# Patient Record
Sex: Female | Born: 1985 | Race: Black or African American | Hispanic: No | Marital: Single | Smoking: Former smoker
Health system: Southern US, Community
[De-identification: ages and names within clinical notes are randomized; demographics above are authoritative.]

## PROBLEM LIST (undated history)

## (undated) ENCOUNTER — Inpatient Hospital Stay (HOSPITAL_COMMUNITY): Payer: Self-pay

## (undated) DIAGNOSIS — O0930 Supervision of pregnancy with insufficient antenatal care, unspecified trimester: Secondary | ICD-10-CM

## (undated) DIAGNOSIS — N760 Acute vaginitis: Secondary | ICD-10-CM

## (undated) DIAGNOSIS — B9689 Other specified bacterial agents as the cause of diseases classified elsewhere: Secondary | ICD-10-CM

## (undated) HISTORY — PX: NO PAST SURGERIES: SHX2092

---
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
## 2007-01-22 ENCOUNTER — Inpatient Hospital Stay (HOSPITAL_COMMUNITY): Admission: AD | Admit: 2007-01-22 | Discharge: 2007-01-22 | Payer: Self-pay | Admitting: Obstetrics

## 2007-01-31 ENCOUNTER — Inpatient Hospital Stay (HOSPITAL_COMMUNITY): Admission: AD | Admit: 2007-01-31 | Discharge: 2007-02-03 | Payer: Self-pay | Admitting: Obstetrics

## 2008-03-31 ENCOUNTER — Emergency Department (HOSPITAL_COMMUNITY): Admission: EM | Admit: 2008-03-31 | Discharge: 2008-03-31 | Payer: Self-pay | Admitting: Family Medicine

## 2008-12-09 ENCOUNTER — Emergency Department (HOSPITAL_COMMUNITY): Admission: EM | Admit: 2008-12-09 | Discharge: 2008-12-09 | Payer: Self-pay | Admitting: Emergency Medicine

## 2009-06-02 ENCOUNTER — Emergency Department (HOSPITAL_COMMUNITY): Admission: EM | Admit: 2009-06-02 | Discharge: 2009-06-02 | Payer: Self-pay | Admitting: Emergency Medicine

## 2009-06-11 IMAGING — US US OB FOLLOW-UP
1 series · 14 of 28 positions shown · non-contrast
Comparison: none

OBSTETRICAL ULTRASOUND:

 This ultrasound exam was performed in the [HOSPITAL] Ultrasound Department.  The OB US report was generated in the AS system, and faxed to the ordering physician.  This report is also available in [REDACTED] PACS.

[Series 1: us ob re-eval · 14 of 47 slices shown]
[im 2/47]
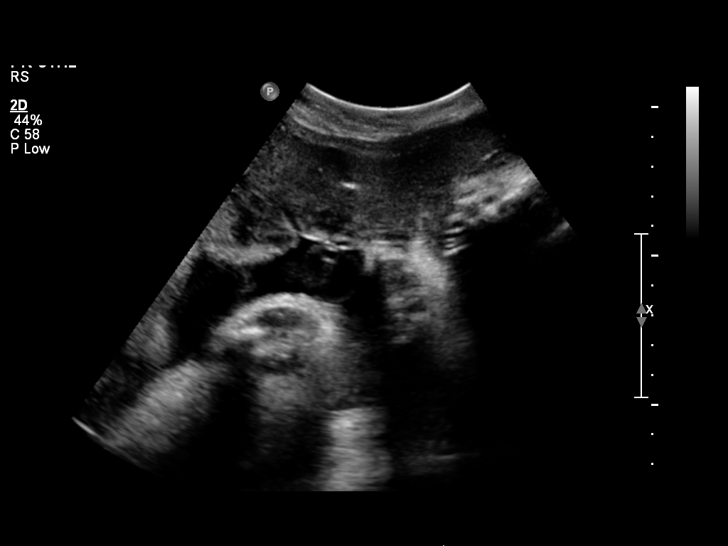
[im 6/47]
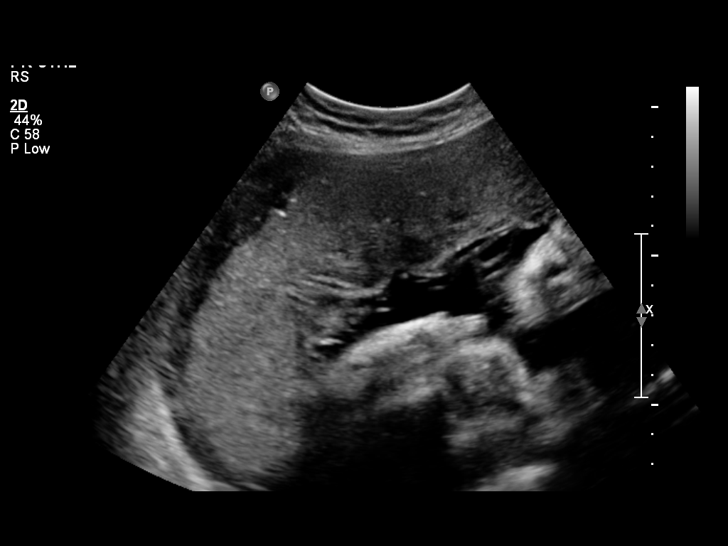
[im 9/47]
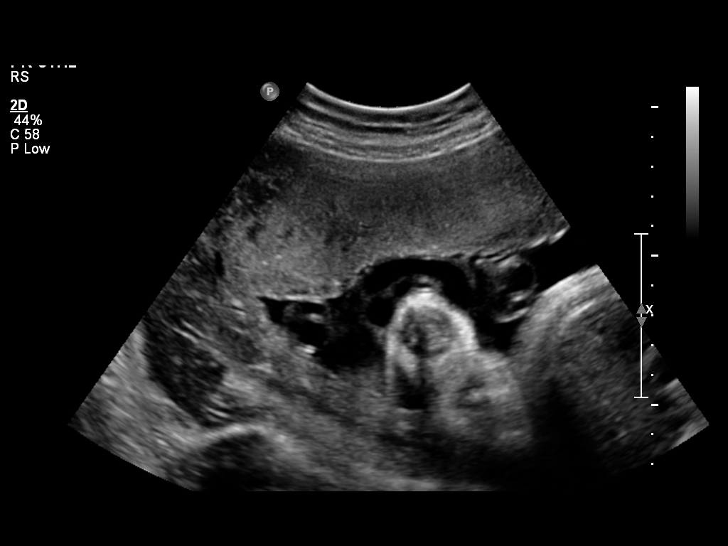
[im 12/47]
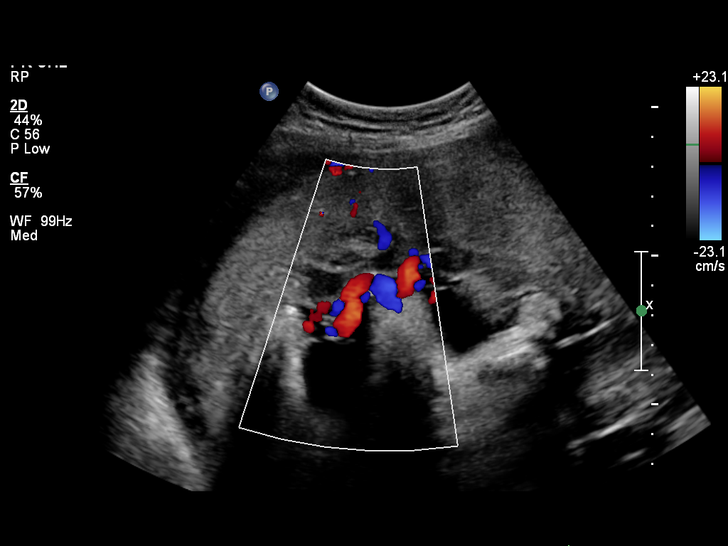
[im 16/47]
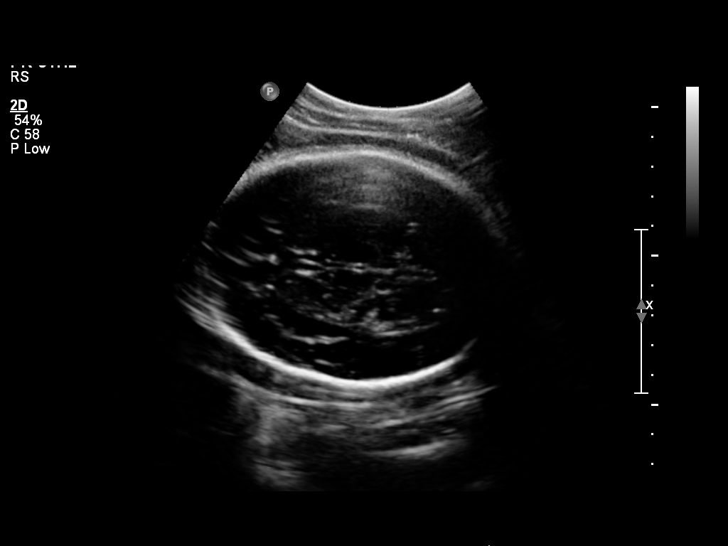
[im 19/47]
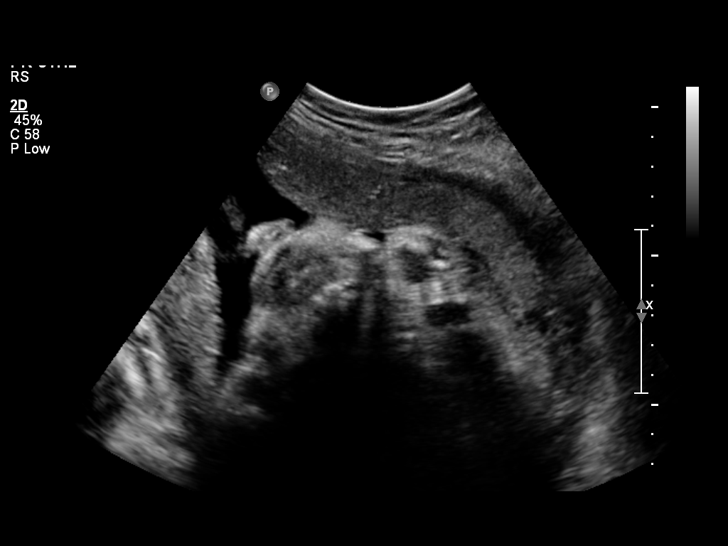
[im 23/47]
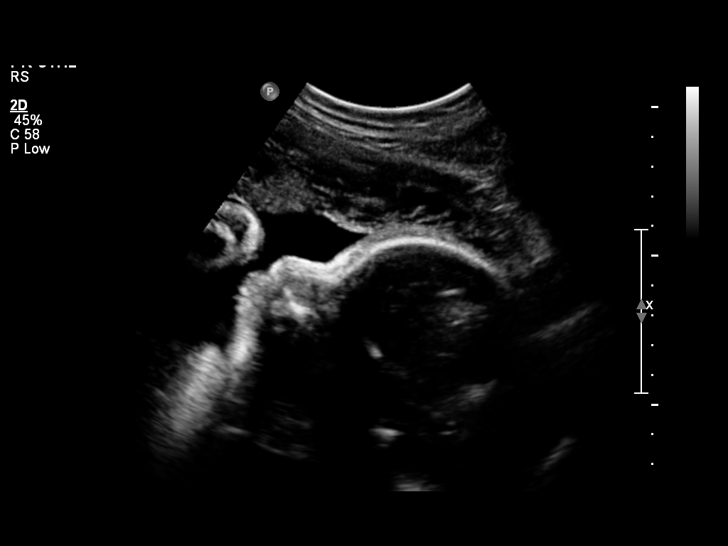
[im 26/47]
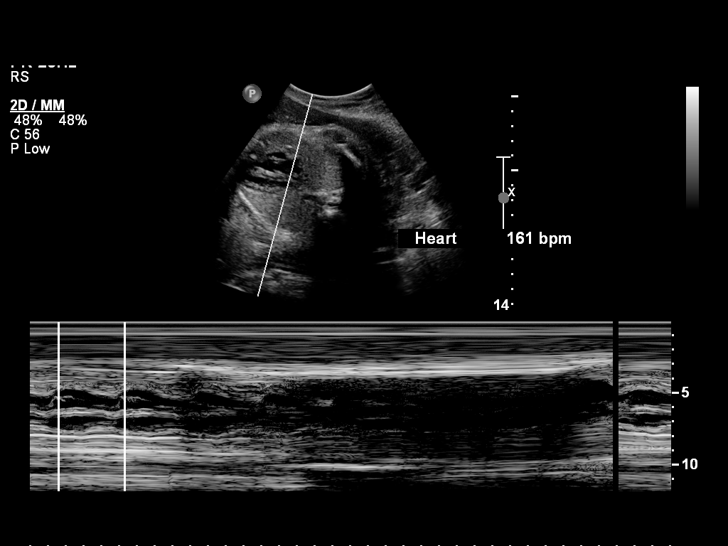
[im 29/47]
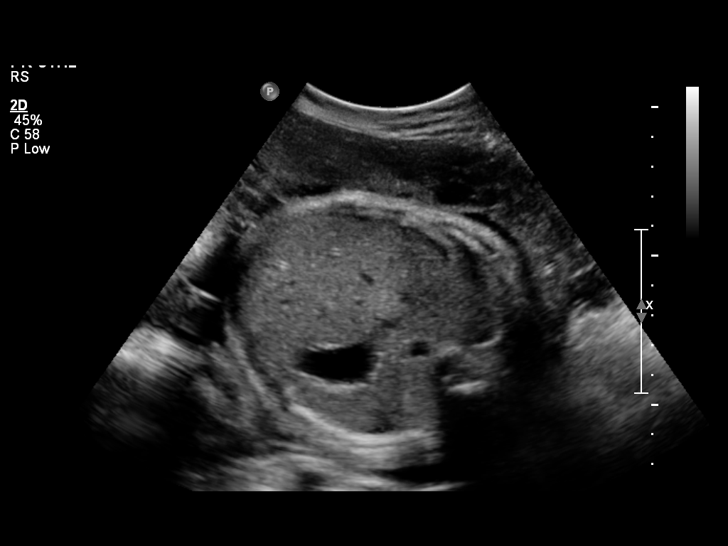
[im 33/47]
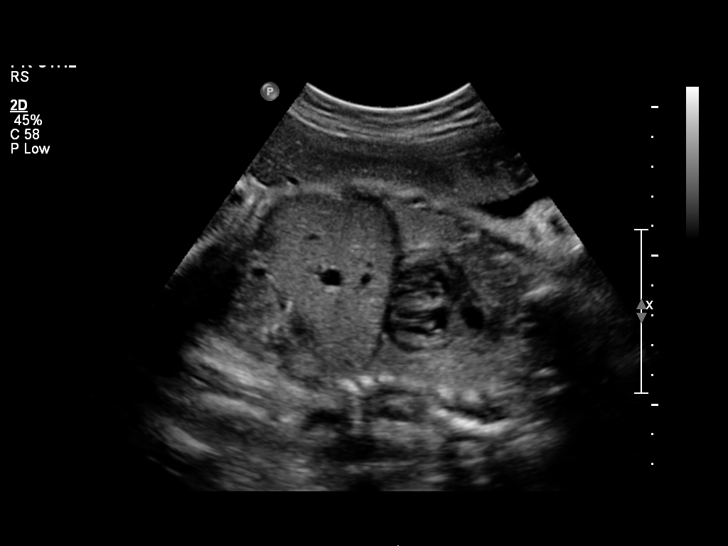
[im 36/47]
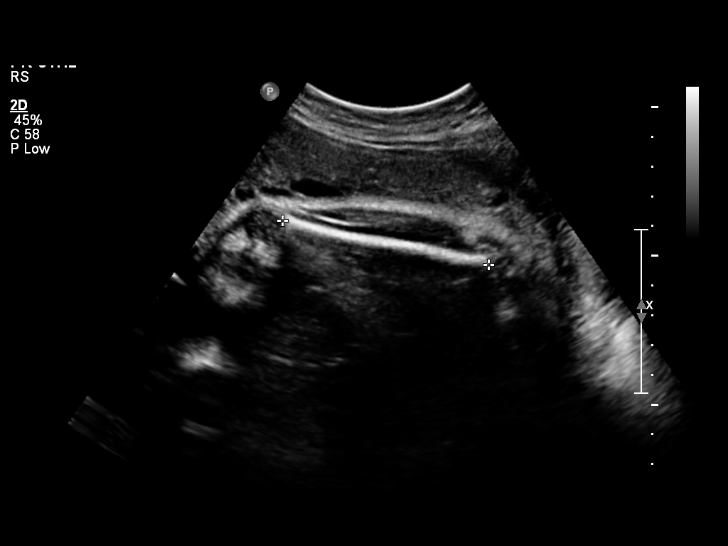
[im 40/47]
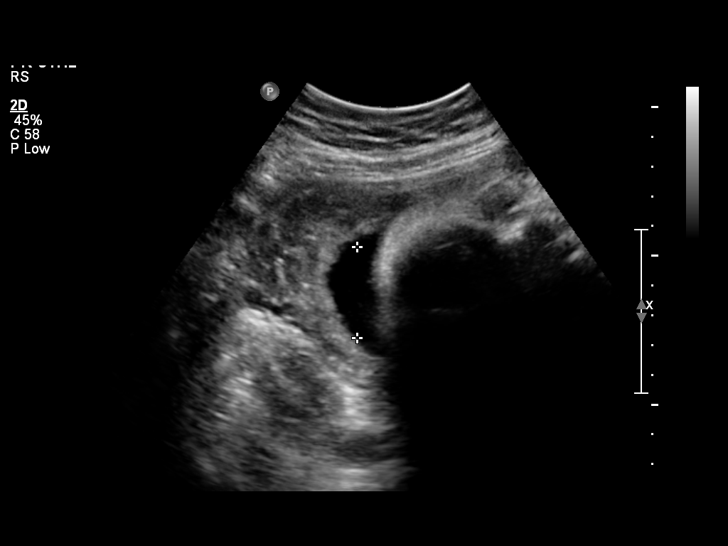
[im 43/47]
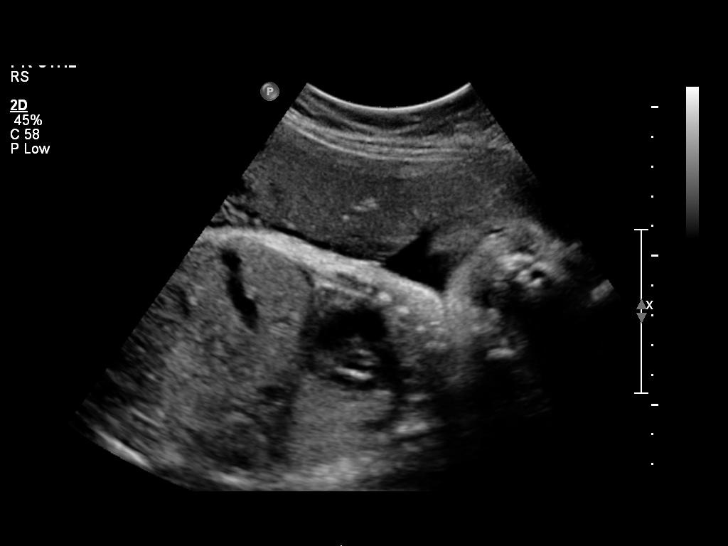
[im 47/47]
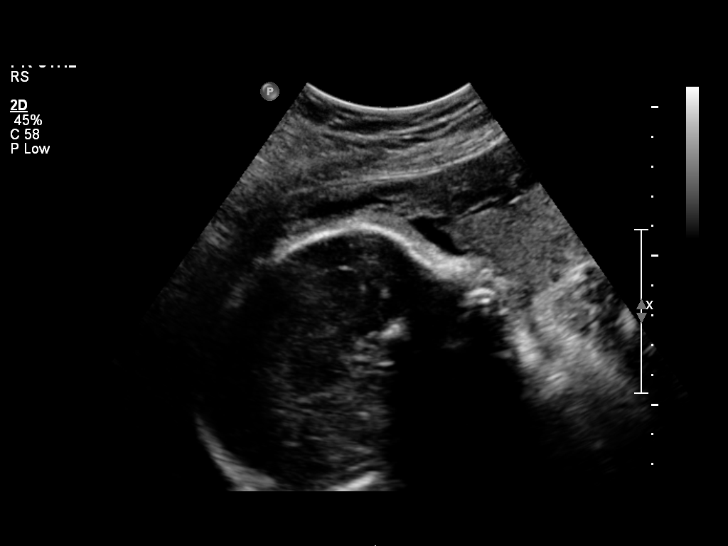

[14 of 28 positions shown; findings below may reference images not displayed]

IMPRESSION: See AS Obstetric US report.

## 2010-03-05 ENCOUNTER — Inpatient Hospital Stay (HOSPITAL_COMMUNITY)
Admission: AD | Admit: 2010-03-05 | Discharge: 2010-03-05 | Payer: Self-pay | Source: Home / Self Care | Attending: Obstetrics | Admitting: Obstetrics

## 2010-03-20 ENCOUNTER — Ambulatory Visit
Admission: RE | Admit: 2010-03-20 | Discharge: 2010-03-20 | Payer: Self-pay | Source: Home / Self Care | Attending: Family Medicine | Admitting: Family Medicine

## 2010-03-24 ENCOUNTER — Inpatient Hospital Stay (HOSPITAL_COMMUNITY)
Admission: AD | Admit: 2010-03-24 | Discharge: 2010-03-26 | Payer: Self-pay | Source: Home / Self Care | Attending: Obstetrics & Gynecology | Admitting: Obstetrics & Gynecology

## 2010-03-24 ENCOUNTER — Encounter: Payer: Self-pay | Admitting: Obstetrics & Gynecology

## 2010-03-24 ENCOUNTER — Ambulatory Visit: Admit: 2010-03-24 | Payer: Self-pay | Admitting: Obstetrics and Gynecology

## 2010-03-31 LAB — URINALYSIS, DIPSTICK ONLY
Bilirubin Urine: NEGATIVE
Hgb urine dipstick: NEGATIVE
Ketones, ur: NEGATIVE mg/dL
Leukocytes, UA: NEGATIVE
Nitrite: NEGATIVE
Protein, ur: NEGATIVE mg/dL
Specific Gravity, Urine: 1.02 (ref 1.005–1.030)
Urine Glucose, Fasting: NEGATIVE mg/dL
Urobilinogen, UA: 0.2 mg/dL (ref 0.0–1.0)
pH: 7 (ref 5.0–8.0)

## 2010-03-31 LAB — COMPREHENSIVE METABOLIC PANEL
ALT: 12 U/L (ref 0–35)
AST: 20 U/L (ref 0–37)
Albumin: 2.5 g/dL — ABNORMAL LOW (ref 3.5–5.2)
Alkaline Phosphatase: 252 U/L — ABNORMAL HIGH (ref 39–117)
BUN: 5 mg/dL — ABNORMAL LOW (ref 6–23)
CO2: 25 mEq/L (ref 19–32)
Calcium: 8.7 mg/dL (ref 8.4–10.5)
Chloride: 104 mEq/L (ref 96–112)
Creatinine, Ser: 0.74 mg/dL (ref 0.4–1.2)
GFR calc Af Amer: >60 mL/min (ref >60)
GFR calc non Af Amer: >60 mL/min (ref >60)
Glucose, Bld: 75 mg/dL (ref 70–99)
Potassium: 3.7 mEq/L (ref 3.5–5.1)
Sodium: 135 mEq/L (ref 135–145)
Total Bilirubin: 0.6 mg/dL (ref 0.3–1.2)
Total Protein: 5.4 g/dL — ABNORMAL LOW (ref 6.0–8.3)

## 2010-03-31 LAB — CBC
HCT: 33.9 % — ABNORMAL LOW (ref 36.0–46.0)
HCT: 32.1 % — ABNORMAL LOW (ref 36.0–46.0)
HCT: 30.9 % — ABNORMAL LOW (ref 36.0–46.0)
Hemoglobin: 11.3 g/dL — ABNORMAL LOW (ref 12.0–15.0)
Hemoglobin: 10.7 g/dL — ABNORMAL LOW (ref 12.0–15.0)
Hemoglobin: 10.3 g/dL — ABNORMAL LOW (ref 12.0–15.0)
MCH: 33.4 pg (ref 26.0–34.0)
MCH: 33.8 pg (ref 26.0–34.0)
MCH: 33.7 pg (ref 26.0–34.0)
MCHC: 33.3 g/dL (ref 30.0–36.0)
MCHC: 33.3 g/dL (ref 30.0–36.0)
MCHC: 33.3 g/dL (ref 30.0–36.0)
MCV: 100.3 fL — ABNORMAL HIGH (ref 78.0–100.0)
MCV: 101.3 fL — ABNORMAL HIGH (ref 78.0–100.0)
MCV: 101 fL — ABNORMAL HIGH (ref 78.0–100.0)
Platelets: 104 10*3/uL — ABNORMAL LOW (ref 150–400)
Platelets: 107 10*3/uL — ABNORMAL LOW (ref 150–400)
Platelets: 103 10*3/uL — ABNORMAL LOW (ref 150–400)
RBC: 3.38 MIL/uL — ABNORMAL LOW (ref 3.87–5.11)
RBC: 3.17 MIL/uL — ABNORMAL LOW (ref 3.87–5.11)
RBC: 3.06 MIL/uL — ABNORMAL LOW (ref 3.87–5.11)
RDW: 13.3 % (ref 11.5–15.5)
RDW: 13.2 % (ref 11.5–15.5)
RDW: 13.2 % (ref 11.5–15.5)
WBC: 8.5 10*3/uL (ref 4.0–10.5)
WBC: 8.6 10*3/uL (ref 4.0–10.5)
WBC: 19.2 10*3/uL — ABNORMAL HIGH (ref 4.0–10.5)

## 2010-03-31 LAB — LACTATE DEHYDROGENASE: LDH: 133 U/L (ref 94–250)

## 2010-03-31 LAB — RPR: RPR Ser Ql: NONREACTIVE

## 2010-03-31 LAB — URIC ACID: Uric Acid, Serum: 4.2 mg/dL (ref 2.4–7.0)

## 2010-03-31 LAB — PLATELET COUNT: Platelets: 85 10*3/uL — ABNORMAL LOW (ref 150–400)

## 2010-05-26 LAB — WET PREP, GENITAL
Clue Cells Wet Prep HPF POC: NONE SEEN
Trich, Wet Prep: NONE SEEN

## 2010-06-08 LAB — URINALYSIS, ROUTINE W REFLEX MICROSCOPIC
Bilirubin Urine: NEGATIVE
Glucose, UA: NEGATIVE mg/dL
Hgb urine dipstick: NEGATIVE
Ketones, ur: NEGATIVE mg/dL
Nitrite: NEGATIVE
Protein, ur: NEGATIVE mg/dL
Specific Gravity, Urine: 1.018 (ref 1.005–1.030)
Urobilinogen, UA: 0.2 mg/dL (ref 0.0–1.0)
pH: 5.5 (ref 5.0–8.0)

## 2010-06-08 LAB — POCT I-STAT, CHEM 8
BUN: 13 mg/dL (ref 6–23)
Calcium, Ion: 1.14 mmol/L (ref 1.12–1.32)
Chloride: 107 mEq/L (ref 96–112)
Creatinine, Ser: 0.9 mg/dL (ref 0.4–1.2)
Glucose, Bld: 127 mg/dL — ABNORMAL HIGH (ref 70–99)
HCT: 42 % (ref 36.0–46.0)
Hemoglobin: 14.3 g/dL (ref 12.0–15.0)
Potassium: 3.2 mEq/L — ABNORMAL LOW (ref 3.5–5.1)
Sodium: 138 mEq/L (ref 135–145)
TCO2: 22 mmol/L (ref 0–100)

## 2010-06-08 LAB — ETHANOL: Alcohol, Ethyl (B): <5 mg/dL (ref 0–10)

## 2010-06-08 LAB — RAPID URINE DRUG SCREEN, HOSP PERFORMED
Amphetamines: NOT DETECTED
Barbiturates: NOT DETECTED
Benzodiazepines: NOT DETECTED
Cocaine: NOT DETECTED
Opiates: NOT DETECTED
Tetrahydrocannabinol: POSITIVE — AB

## 2010-06-08 LAB — POCT PREGNANCY, URINE: Preg Test, Ur: NEGATIVE

## 2010-06-16 ENCOUNTER — Inpatient Hospital Stay (HOSPITAL_COMMUNITY)
Admission: AD | Admit: 2010-06-16 | Discharge: 2010-06-16 | Disposition: A | Discharge status: Home / Self Care | Payer: Self-pay | Source: Ambulatory Visit | Attending: Obstetrics & Gynecology | Admitting: Obstetrics & Gynecology

## 2010-06-16 ENCOUNTER — Other Ambulatory Visit: Payer: Self-pay

## 2010-06-16 ENCOUNTER — Ambulatory Visit (INDEPENDENT_AMBULATORY_CARE_PROVIDER_SITE_OTHER): Payer: Self-pay | Admitting: Obstetrics & Gynecology

## 2010-06-16 DIAGNOSIS — Z3049 Encounter for surveillance of other contraceptives: Secondary | ICD-10-CM

## 2010-06-17 NOTE — Progress Notes (Signed)
NAME:  Ellen Hall, Ellen Hall NO.:  [id number]  MEDICAL RECORD NO.:  [id number]           PATIENT TYPE:  O  LOCATION:  [facility]                    FACILITY:  [facility]  PHYSICIAN:  Maylon Cos, CNM    DATE OF BIRTH:  1985/11/02  DATE OF SERVICE:  06/16/2010                                 CLINIC NOTE  REASON FOR TODAY'S VISIT:  Postpartum check.  HISTORY OF PRESENT ILLNESS:  Ellen Hall is a G3, P3, at about 3 months postpartum here for her routine postpartum exam.  She is status post SVT on March 24, 2010.  She received Depo shot prior to her discharge on March 25, 2010.  She states that she stopped bleeding in late January and then had some more bleeding early February, but has not had any bleeding since.  She denies any pain today.  She is bottle feeding.  She denies any problems with her breast and she does complain of allergy symptoms of itchy eyes, runny nose, and sneezing.  She has been taking occasional Claritin, but has not been helping.  She is requesting a prescription for allergy medication today.  She did have a history of Chlamydia during this pregnancy.  She had a history of abnormal Pap smear at some point in the past, but she cannot say when.  Her last Pap smear was with Dr. Gaynell Face during this pregnancy, but she is not sure of the results of that and we do not have record of that in the clinic.  PAST MEDICAL HISTORY:  Unchanged since pregnancy, negative other than Chlamydia during pregnancy.  OBJECTIVE:  VITAL SIGNS:  Temperature 97.6, pulse 61, blood pressure 124/78, weight 188.7. GENERAL:  Well-appearing female in no acute distress, obese. ABDOMEN:  Soft and nontender. PELVIC:  Normal vaginal mucosa. EXTERNAL GENITALIA:  No vaginal bleeding.  Small amount of light brown vaginal discharge.  No malodor.  Pap smear and gonorrhea and Chlamydia collected.  No cervical motion tenderness.  Uterus nonenlarged and nontender.  Adnexa nonenlarged and  nontender.  ASSESSMENT AND PLAN:  G3, P3 at 3 months postpartum.  Rx given for Allegra 180 mg daily, depo shot given today.  Instructed to follow up every 3 months for depo or follow up as indicated by Pap smear, gonorrhea, and Chlamydia testing.          ______________________________ Maylon Cos, CNM    SS/MEDQ  D:  06/16/2010  T:  06/17/2010  Job:  [id number]

## 2010-09-01 ENCOUNTER — Ambulatory Visit: Payer: Self-pay

## 2010-09-09 ENCOUNTER — Ambulatory Visit (INDEPENDENT_AMBULATORY_CARE_PROVIDER_SITE_OTHER): Payer: Medicaid Other

## 2010-09-09 DIAGNOSIS — Z3049 Encounter for surveillance of other contraceptives: Secondary | ICD-10-CM

## 2010-11-25 ENCOUNTER — Ambulatory Visit: Payer: Medicaid Other

## 2010-12-23 LAB — CBC
HCT: 26.7 — ABNORMAL LOW
HCT: 32.5 — ABNORMAL LOW
Hemoglobin: 11.3 — ABNORMAL LOW
Hemoglobin: 9.3 — ABNORMAL LOW
MCHC: 34.8
MCHC: 35
MCV: 97
MCV: 97.1
Platelets: 135 — ABNORMAL LOW
Platelets: 151
RBC: 2.75 — ABNORMAL LOW
RBC: 3.35 — ABNORMAL LOW
RDW: 13.8
RDW: 14
WBC: 10.4
WBC: 13 — ABNORMAL HIGH

## 2010-12-23 LAB — CCBB MATERNAL DONOR DRAW

## 2010-12-23 LAB — RPR: RPR Ser Ql: NONREACTIVE

## 2010-12-25 LAB — URINALYSIS, ROUTINE W REFLEX MICROSCOPIC
Bilirubin Urine: NEGATIVE
Glucose, UA: NEGATIVE
Ketones, ur: NEGATIVE
Nitrite: NEGATIVE
Protein, ur: NEGATIVE
Specific Gravity, Urine: >1.03 — ABNORMAL HIGH
Urobilinogen, UA: 0.2
pH: 6

## 2010-12-25 LAB — URINE MICROSCOPIC-ADD ON

## 2012-03-16 NOTE — L&D Delivery Note (Signed)
Delivery Note At 4:00 AM a viable and healthy female was delivered via Vaginal, Spontaneous Delivery (Presentation: Middle Occiput Anterior).  APGAR: 7, ; weight pending.   Placenta status: Intact, Spontaneous.  Cord: 3 vessels with the following complications: None.    Anesthesia: Epidural  Episiotomy: None Lacerations: None Suture Repair: none Est. Blood Loss (mL): 350  Mom to postpartum.  Baby to nursery-stable.  [name] 10/27/2012, 4:27 AM

## 2012-03-16 NOTE — L&D Delivery Note (Signed)
I have seen and examined this patient and I agree with the above. (Second APGAR still pending at note time). Cam Hai 4:56 AM 10/27/2012

## 2012-08-16 ENCOUNTER — Inpatient Hospital Stay (HOSPITAL_COMMUNITY)
Admission: AD | Admit: 2012-08-16 | Discharge: 2012-08-16 | Disposition: A | Discharge status: Home / Self Care | Payer: Self-pay | Source: Ambulatory Visit | Attending: Obstetrics & Gynecology | Admitting: Obstetrics & Gynecology

## 2012-08-16 ENCOUNTER — Encounter (HOSPITAL_COMMUNITY): Payer: Self-pay | Admitting: *Deleted

## 2012-08-16 DIAGNOSIS — R109 Unspecified abdominal pain: Secondary | ICD-10-CM | POA: Insufficient documentation

## 2012-08-16 DIAGNOSIS — N949 Unspecified condition associated with female genital organs and menstrual cycle: Secondary | ICD-10-CM

## 2012-08-16 DIAGNOSIS — A499 Bacterial infection, unspecified: Secondary | ICD-10-CM

## 2012-08-16 DIAGNOSIS — O2390 Unspecified genitourinary tract infection in pregnancy, unspecified trimester: Secondary | ICD-10-CM | POA: Insufficient documentation

## 2012-08-16 DIAGNOSIS — O0930 Supervision of pregnancy with insufficient antenatal care, unspecified trimester: Secondary | ICD-10-CM | POA: Insufficient documentation

## 2012-08-16 DIAGNOSIS — N760 Acute vaginitis: Secondary | ICD-10-CM | POA: Insufficient documentation

## 2012-08-16 DIAGNOSIS — O0933 Supervision of pregnancy with insufficient antenatal care, third trimester: Secondary | ICD-10-CM

## 2012-08-16 DIAGNOSIS — B9689 Other specified bacterial agents as the cause of diseases classified elsewhere: Secondary | ICD-10-CM | POA: Insufficient documentation

## 2012-08-16 LAB — RAPID URINE DRUG SCREEN, HOSP PERFORMED
Amphetamines: NOT DETECTED
Barbiturates: NOT DETECTED
Tetrahydrocannabinol: POSITIVE — AB

## 2012-08-16 LAB — URINE MICROSCOPIC-ADD ON

## 2012-08-16 LAB — URINALYSIS, ROUTINE W REFLEX MICROSCOPIC
Bilirubin Urine: NEGATIVE
Ketones, ur: NEGATIVE mg/dL
Nitrite: NEGATIVE
Urobilinogen, UA: 0.2 mg/dL (ref 0.0–1.0)
pH: 6 (ref 5.0–8.0)

## 2012-08-16 LAB — DIFFERENTIAL
Basophils Absolute: 0 10*3/uL (ref 0.0–0.1)
Lymphocytes Relative: 19 % (ref 12–46)
Monocytes Absolute: 0.7 10*3/uL (ref 0.1–1.0)
Neutro Abs: 7.3 10*3/uL (ref 1.7–7.7)

## 2012-08-16 LAB — OB RESULTS CONSOLE GC/CHLAMYDIA
Chlamydia: NEGATIVE
Gonorrhea: NEGATIVE

## 2012-08-16 LAB — CBC
HCT: 32 % — ABNORMAL LOW (ref 36.0–46.0)
Platelets: 135 10*3/uL — ABNORMAL LOW (ref 150–400)
RDW: 13 % (ref 11.5–15.5)
WBC: 10.3 10*3/uL (ref 4.0–10.5)

## 2012-08-16 LAB — RPR: RPR Ser Ql: NONREACTIVE

## 2012-08-16 LAB — HIV ANTIBODY (ROUTINE TESTING W REFLEX): HIV: NONREACTIVE

## 2012-08-16 LAB — GLUCOSE, CAPILLARY: Glucose-Capillary: 75 mg/dL (ref 70–99)

## 2012-08-16 LAB — WET PREP, GENITAL: Yeast Wet Prep HPF POC: NONE SEEN

## 2012-08-16 MED ORDER — PRENATAL PLUS 27-1 MG PO TABS: 1.0000 | ORAL_TABLET | Freq: Every day | ORAL | Status: DC

## 2012-08-16 MED ORDER — METRONIDAZOLE 500 MG PO TABS: 500.0000 mg | ORAL_TABLET | Freq: Three times a day (TID) | ORAL | Status: DC

## 2012-08-16 NOTE — MAU Note (Signed)
C/o abdominal pain that started around 0650; No prenatal care;

## 2012-08-16 NOTE — MAU Provider Note (Signed)
Attestation of Attending Supervision of Advanced Practitioner (CNM/NP): Evaluation and management procedures were performed by the Advanced Practitioner under my supervision and collaboration.  I have reviewed the Advanced Practitioner's note and chart, and I agree with the management and plan.  HARRAWAY-SMITH, Jacaden Forbush 3:05 PM

## 2012-08-16 NOTE — MAU Provider Note (Signed)
History   27 y/o [id number] with uncertain LMP ( NPC near term size) presents with mild intermittent abdominal cramps.  Bilateral groin pains worse with walking, relieved with rest. Denies dysuria or abnormal vaginal discharge. Denies drug use. Patient denies any vaginal bleeding, vaginal discharge, or fluid leakage.  She reports good fetal movement.  Patient denies any sexual intercourse over the past month.  Patient denies any lower extremity swelling.    CSN: [id number]  Arrival date and time: 08/16/12 1218   First Provider Initiated Contact with Patient 08/16/12 1331      Chief Complaint  Patient presents with  . Abdominal Pain   Abdominal Pain   Past Medical History  Diagnosis Date  . Medical history non-contributory    OB HX: NSVD x3 w/o complications Past Surgical History  Procedure Laterality Date  . No past surgeries      Family History  Problem Relation Age of Onset  . Diabetes Mother   . Diabetes Sister   . Diabetes Maternal Grandmother     History  Substance Use Topics  . Smoking status: Current Some Day Smoker  . Smokeless tobacco: Not on file  . Alcohol Use: No    Allergies: No Known Allergies  No prescriptions prior to admission    Review of Systems  Constitutional: Negative.   Respiratory: Negative.   Cardiovascular: Negative.   Gastrointestinal: Positive for abdominal pain.   Physical Exam   Blood pressure 122/61, pulse 84, temperature 97.6 F (36.4 C), temperature source Oral, resp. rate 16, height 5\' 4"  (1.626 m), weight 92.534 kg (204 lb).  Physical Exam  Constitutional: She appears well-developed and well-nourished.  Cardiovascular: Normal rate, regular rhythm and normal heart sounds.   Respiratory: Effort normal and breath sounds normal.  Genitourinary: Vagina normal. Cervix exhibits discharge.  Scant milky white cervical discharge.  Fingertip, thick, and high.    FHR - 130s Fundal Height - 34.5 cm    MAU Course   Procedures Bedside US: singleton, cephalic, normal AFV Results for orders placed during the hospital encounter of 08/16/12 (from the past 24 hour(s))  URINALYSIS, ROUTINE W REFLEX MICROSCOPIC     Status: Abnormal   Collection Time    08/16/12 12:42 PM      Result Value Range   Color, Urine YELLOW  YELLOW   APPearance HAZY (*) CLEAR   Specific Gravity, Urine 1.025  1.005 - 1.030   pH 6.0  5.0 - 8.0   Glucose, UA NEGATIVE  NEGATIVE mg/dL   Hgb urine dipstick NEGATIVE  NEGATIVE   Bilirubin Urine NEGATIVE  NEGATIVE   Ketones, ur NEGATIVE  NEGATIVE mg/dL   Protein, ur NEGATIVE  NEGATIVE mg/dL   Urobilinogen, UA 0.2  0.0 - 1.0 mg/dL   Nitrite NEGATIVE  NEGATIVE   Leukocytes, UA TRACE (*) NEGATIVE  URINE MICROSCOPIC-ADD ON     Status: None   Collection Time    08/16/12 12:42 PM      Result Value Range   Squamous Epithelial / LPF RARE  RARE   WBC, UA 3-6  <3 WBC/hpf   Bacteria, UA RARE  RARE  WET PREP, GENITAL     Status: Abnormal   Collection Time    08/16/12  1:45 PM      Result Value Range   Yeast Wet Prep HPF POC NONE SEEN  NONE SEEN   Trich, Wet Prep NONE SEEN  NONE SEEN   Clue Cells Wet Prep HPF POC FEW (*) NONE SEEN  WBC, Wet Prep HPF POC MODERATE (*) NONE SEEN  CBC     Status: Abnormal   Collection Time    08/16/12  2:10 PM      Result Value Range   WBC 10.3  4.0 - 10.5 K/uL   RBC 3.26 (*) 3.87 - 5.11 MIL/uL   Hemoglobin 10.7 (*) 12.0 - 15.0 g/dL   HCT 55.7 (*) 32.2 - 02.5 %   MCV 98.2  78.0 - 100.0 fL   MCH 32.8  26.0 - 34.0 pg   MCHC 33.4  30.0 - 36.0 g/dL   RDW 42.7  06.2 - 37.6 %   Platelets 135 (*) 150 - 400 K/uL  DIFFERENTIAL     Status: None   Collection Time    08/16/12  2:10 PM      Result Value Range   Neutrophils Relative % 72  43 - 77 %   Neutro Abs 7.3  1.7 - 7.7 K/uL   Lymphocytes Relative 19  12 - 46 %   Lymphs Abs 2.0  0.7 - 4.0 K/uL   Monocytes Relative 6  3 - 12 %   Monocytes Absolute 0.7  0.1 - 1.0 K/uL   Eosinophils Relative 3  0 - 5 %    Eosinophils Absolute 0.3  0.0 - 0.7 K/uL   Basophils Relative 0  0 - 1 %   Basophils Absolute 0.0  0.0 - 0.1 K/uL  GLUCOSE, CAPILLARY     Status: None   Collection Time    08/16/12  2:10 PM      Result Value Range   Glucose-Capillary 75  70 - 99 mg/dL   Assessment and Plan  26 y/o G4P3003 approx 34 wks NPC Category 1 FHR RLP BV  NOB with glucola next Tuesday per Tresa Endo Rasette. OB panel sent. UDS sent Follow-up Information   Follow up with [facility] On 08/23/2012. (27 y/o G4P3 with uncertain LMP, best dated by size as measuring approximately 34.5 cm, presents with abdominal cramps. pointment time 8:30am. Someone from Ultrasound will call you with appointment this week)    Contact information:   [phone]        Celise Bazar 08/16/2012, 2:05 PM

## 2012-08-17 LAB — GC/CHLAMYDIA PROBE AMP
CT Probe RNA: NEGATIVE
GC Probe RNA: NEGATIVE

## 2012-08-19 ENCOUNTER — Ambulatory Visit (HOSPITAL_COMMUNITY)
Admission: RE | Admit: 2012-08-19 | Discharge: 2012-08-19 | Disposition: A | Discharge status: Home / Self Care | Payer: MEDICAID | Source: Ambulatory Visit | Attending: Obstetrics and Gynecology | Admitting: Obstetrics and Gynecology

## 2012-08-19 DIAGNOSIS — O0930 Supervision of pregnancy with insufficient antenatal care, unspecified trimester: Secondary | ICD-10-CM | POA: Insufficient documentation

## 2012-08-19 DIAGNOSIS — Z3689 Encounter for other specified antenatal screening: Secondary | ICD-10-CM | POA: Insufficient documentation

## 2012-08-19 DIAGNOSIS — O0933 Supervision of pregnancy with insufficient antenatal care, third trimester: Secondary | ICD-10-CM

## 2012-08-19 DIAGNOSIS — O99891 Other specified diseases and conditions complicating pregnancy: Secondary | ICD-10-CM | POA: Insufficient documentation

## 2012-08-23 ENCOUNTER — Encounter: Payer: Self-pay | Admitting: Obstetrics & Gynecology

## 2012-09-07 ENCOUNTER — Encounter: Payer: Self-pay | Admitting: Advanced Practice Midwife

## 2012-09-07 ENCOUNTER — Ambulatory Visit (INDEPENDENT_AMBULATORY_CARE_PROVIDER_SITE_OTHER): Payer: Self-pay | Admitting: Advanced Practice Midwife

## 2012-09-07 VITALS — BP 115/71 | Temp 97.1°F | Wt 205.0 lb

## 2012-09-07 DIAGNOSIS — O0930 Supervision of pregnancy with insufficient antenatal care, unspecified trimester: Secondary | ICD-10-CM

## 2012-09-07 DIAGNOSIS — O0933 Supervision of pregnancy with insufficient antenatal care, third trimester: Secondary | ICD-10-CM

## 2012-09-07 LAB — POCT URINALYSIS DIP (DEVICE)
Glucose, UA: NEGATIVE mg/dL
Nitrite: NEGATIVE
Urobilinogen, UA: 0.2 mg/dL (ref 0.0–1.0)

## 2012-09-07 LAB — OB RESULTS CONSOLE RUBELLA ANTIBODY, IGM
Rubella: NON-IMMUNE/NOT IMMUNE
Rubella: NON-IMMUNE/NOT IMMUNE
Rubella: NON-IMMUNE/NOT IMMUNE

## 2012-09-07 NOTE — Patient Instructions (Addendum)

## 2012-09-07 NOTE — Progress Notes (Signed)
Pulse: 92

## 2012-09-07 NOTE — Progress Notes (Signed)
New OB visit. See other note.  Subjective:    Ellen Hall is a [id number] [redacted]w[redacted]d being seen today for her first obstetrical visit.  Her obstetrical history is significant for Late to care. Patient does intend to breast feed. Pregnancy history fully reviewed.  Patient reports no complaints.  Filed Vitals:   09/07/12 1005  BP: 115/71  Temp: 97.1 F (36.2 C)  Weight: 92.987 kg (205 lb)    HISTORY: OB History   Grav Para Term Preterm Abortions TAB SAB Ect Mult Living   4 3 3       3      # Outc Date GA Lbr Len/2nd Wgt Sex Del Anes PTL Lv   1 TRM 8/07 [redacted]w[redacted]d   F SVD EPI  Yes   2 TRM 11/08 [redacted]w[redacted]d  4.026kg(8lb14oz) M SVD EPI  Yes   3 TRM 1/12 [redacted]w[redacted]d   M SVD EPI  Yes   4 CUR              Past Medical History  Diagnosis Date  . Medical history non-contributory    Past Surgical History  Procedure Laterality Date  . No past surgeries     Family History  Problem Relation Age of Onset  . Diabetes Mother   . Diabetes Sister   . Diabetes Maternal Grandmother      Exam    Uterus:  Fundal Height: 34 cm  Pelvic Exam:    Perineum: Not examined. Pt wants to wait until GBS visit   Vulva: N/a   Vagina:  n/a   pH:    Cervix: n/a   Adnexa: not evaluated   Bony Pelvis: gynecoid and Proven to 8+14  System: Breast:  normal appearance, no masses or tenderness   Skin: normal coloration and turgor, no rashes    Neurologic: oriented, normal, grossly non-focal   Extremities: normal strength, tone, and muscle mass   HEENT neck supple with midline trachea   Mouth/Teeth mucous membranes moist, pharynx normal without lesions   Neck supple and no masses   Cardiovascular: regular rate and rhythm   Respiratory:  appears well, vitals normal, no respiratory distress, acyanotic, normal RR, ear and throat exam is normal, neck free of mass or lymphadenopathy, chest clear, no wheezing, crepitations, rhonchi, normal symmetric air entry   Abdomen: soft, non-tender; bowel sounds normal; no masses,  no  organomegaly   Urinary: n/a      Assessment:    Pregnancy: [id number] Patient Active Problem List   Diagnosis Date Noted  . Insufficient prenatal care in third trimester 08/16/2012        Plan:     Initial labs drawn. Prenatal vitamins. Problem list reviewed and updated. Genetic Screening discussed Quad Screen: Too Late.  Ultrasound discussed; fetal survey: results reviewed.  Follow up in 2 weeks. 50% of 30 min visit spent on counseling and coordination of care.   Reviewed PTL precautions, leaking, bleeding, decreased movement.    [facility] 09/07/2012  Will plan GBS and cultures at next visit. Needs pap at next visit.  Cultures done on past MAU visit (08/16/12) negative.

## 2012-09-08 LAB — OBSTETRIC PANEL
Basophils Absolute: 0 10*3/uL (ref 0.0–0.1)
Eosinophils Absolute: 0.1 10*3/uL (ref 0.0–0.7)
Hepatitis B Surface Ag: NEGATIVE
Lymphocytes Relative: 15 % (ref 12–46)
Lymphs Abs: 1.4 10*3/uL (ref 0.7–4.0)
Neutrophils Relative %: 79 % — ABNORMAL HIGH (ref 43–77)
Platelets: 153 10*3/uL (ref 150–400)
RBC: 3.07 MIL/uL — ABNORMAL LOW (ref 3.87–5.11)
RDW: 13.7 % (ref 11.5–15.5)
Rubella: 0.87 Index (ref <0.90)
WBC: 9 10*3/uL (ref 4.0–10.5)

## 2012-09-08 LAB — GLUCOSE TOLERANCE, 1 HOUR (50G) W/O FASTING: Glucose, 1 Hour GTT: 153 mg/dL — ABNORMAL HIGH (ref 70–140)

## 2012-09-09 ENCOUNTER — Encounter: Payer: Self-pay | Admitting: General Practice

## 2012-09-09 ENCOUNTER — Telehealth: Payer: Self-pay | Admitting: General Practice

## 2012-09-09 NOTE — Telephone Encounter (Signed)
Called patient and a message stated the phone number was disconnected or incorrect. No other numbers on file for the patient. Will send letter.

## 2012-09-09 NOTE — Telephone Encounter (Signed)
Message copied by Kathee Delton on Fri Sep 09, 2012 12:06 PM ------      Message from: [place], [place] L      Created: Thu Sep 08, 2012  3:32 PM       Glucola elevated 153. Needs 3 hr test ------

## 2012-09-12 ENCOUNTER — Encounter: Payer: Self-pay | Admitting: Advanced Practice Midwife

## 2012-09-12 DIAGNOSIS — Z23 Encounter for immunization: Secondary | ICD-10-CM | POA: Insufficient documentation

## 2012-09-12 LAB — HEMOGLOBINOPATHY EVALUATION
Hgb A2 Quant: 2.7 % (ref 2.2–3.2)
Hgb F Quant: 0 % (ref 0.0–2.0)
Hgb S Quant: 0 %

## 2012-09-21 ENCOUNTER — Encounter: Payer: Self-pay | Admitting: Advanced Practice Midwife

## 2012-09-22 ENCOUNTER — Other Ambulatory Visit: Payer: Self-pay

## 2012-10-05 ENCOUNTER — Encounter: Payer: Self-pay | Admitting: Family Medicine

## 2012-10-21 ENCOUNTER — Encounter (HOSPITAL_COMMUNITY): Payer: Self-pay | Admitting: *Deleted

## 2012-10-21 ENCOUNTER — Inpatient Hospital Stay (HOSPITAL_COMMUNITY)
Admission: AD | Admit: 2012-10-21 | Discharge: 2012-10-21 | Disposition: A | Discharge status: Home / Self Care | Payer: Medicaid Other | Source: Ambulatory Visit | Attending: Obstetrics and Gynecology | Admitting: Obstetrics and Gynecology

## 2012-10-21 DIAGNOSIS — O471 False labor at or after 37 completed weeks of gestation: Secondary | ICD-10-CM

## 2012-10-21 DIAGNOSIS — O479 False labor, unspecified: Secondary | ICD-10-CM | POA: Insufficient documentation

## 2012-10-21 LAB — OB RESULTS CONSOLE GBS: GBS: NEGATIVE

## 2012-10-21 NOTE — MAU Provider Note (Signed)
  History     CSN: [id number]  Arrival date and time: 10/21/12 1636   None     Chief Complaint  Patient presents with  . Contractions   HPI 27 y.o. [id number] at [redacted]w[redacted]d presents for labor eval. Has felt contractions for the past 3 days, but does not feel like they are getting any stronger. Denies gush of fluid, vaginal bleeding.  OB History   Grav Para Term Preterm Abortions TAB SAB Ect Mult Living   4 3 3       3       Past Medical History  Diagnosis Date  . Medical history non-contributory     Past Surgical History  Procedure Laterality Date  . No past surgeries      Family History  Problem Relation Age of Onset  . Diabetes Mother   . Diabetes Sister   . Diabetes Maternal Grandmother     History  Substance Use Topics  . Smoking status: Former Games developer  . Smokeless tobacco: Never Used  . Alcohol Use: No    Allergies: No Known Allergies  Prescriptions prior to admission  Medication Sig Dispense Refill  . prenatal vitamin w/FE, FA (PRENATAL 1 + 1) 27-1 MG TABS Take 1 tablet by mouth daily.  30 each  0    ROS negative except as above Physical Exam   Blood pressure 123/58, pulse 71, temperature 98.8 F (37.1 C), temperature source Oral, resp. rate 18.  Physical Exam  Cervical check at 1715: Dilation: 2.5 Effacement (%): 50 Cervical Position: Posterior Station: -3 Presentation: Vertex Exam by:: Sarajane Marek, RNC   Re-check at 1900: Dilation: 2.5 Effacement (%): 50 Cervical Position: Posterior Station: Ballotable Presentation: Vertex Exam by:: DR WHITE No bloody show   FHT: 130bpm, moderate var, >15x15 accels present, no decels Toco: irritability, irregular contractions MAU Course  Procedures  MDM   Assessment and Plan  26 y.o. [id number] at [redacted]w[redacted]d   No cervical changes Not in good contraction pattern Has not been seen for prenatal care since 7/23, no appointment scheduled Message sent to clinic for follow up and I stressed to the patient that  it is very important to be seen ASAP, she will call on Monday morning FWB: cat I, reassuring Stable for discharge  [name] 10/21/2012, 7:06 PM   I have seen and examined this patient and agree with above documentation in the resident's note. Labor check with unchanged cervix.  [redacted]w[redacted]d today- again stressed importance of f/u on Monday in clinic for visit and testing as needed.   Rulon Abide, M.D. [facility] Fellow 10/21/2012 8:25 PM

## 2012-10-21 NOTE — MAU Note (Signed)
Contractions X 2-3 days. Contractions not any stronger. Just came in to "get checked." Has had a lapse in care due to lack of transportation. Last PNV beginning of July. Goes to [employer].

## 2012-10-23 NOTE — MAU Provider Note (Signed)
Attestation of Attending Supervision of Advanced Practitioner: Evaluation and management procedures were performed by the PA/NP/CNM/OB Fellow under my supervision/collaboration. Chart reviewed and agree with management and plan.  Denario Bagot V 10/23/2012 12:20 PM    

## 2012-10-24 ENCOUNTER — Encounter: Payer: Self-pay | Admitting: Obstetrics and Gynecology

## 2012-10-24 LAB — CULTURE, BETA STREP (GROUP B ONLY)

## 2012-10-25 ENCOUNTER — Ambulatory Visit (INDEPENDENT_AMBULATORY_CARE_PROVIDER_SITE_OTHER): Payer: Medicaid Other | Admitting: Family Medicine

## 2012-10-25 VITALS — BP 105/68 | Temp 97.2°F | Wt 205.0 lb

## 2012-10-25 DIAGNOSIS — O480 Post-term pregnancy: Secondary | ICD-10-CM

## 2012-10-25 DIAGNOSIS — O0930 Supervision of pregnancy with insufficient antenatal care, unspecified trimester: Secondary | ICD-10-CM

## 2012-10-25 DIAGNOSIS — O99810 Abnormal glucose complicating pregnancy: Secondary | ICD-10-CM

## 2012-10-25 LAB — POCT URINALYSIS DIP (DEVICE)
Bilirubin Urine: NEGATIVE
Glucose, UA: NEGATIVE mg/dL
Hgb urine dipstick: NEGATIVE
Ketones, ur: NEGATIVE mg/dL
Nitrite: NEGATIVE
Specific Gravity, Urine: 1.025 (ref 1.005–1.030)
pH: 7 (ref 5.0–8.0)

## 2012-10-25 LAB — GLUCOSE, CAPILLARY

## 2012-10-25 NOTE — Progress Notes (Signed)
CBG for failed 1 hour Needs NST today and AFI and IOL scheduled NST reviewed and reactive.

## 2012-10-25 NOTE — Progress Notes (Signed)
Random CBG = 82   IOL scheduled 8/13 @ 0730

## 2012-10-25 NOTE — Progress Notes (Signed)
Pulse- 93 Patient reports lower abdominal/pelvic pain & pressure and irregular contractions

## 2012-10-25 NOTE — Patient Instructions (Addendum)
Contraception Choices Contraception (birth control) is the use of any methods or devices to prevent pregnancy. Below are some methods to help avoid pregnancy. HORMONAL METHODS   Contraceptive implant. This is a thin, plastic tube containing progesterone hormone. It does not contain estrogen hormone. Your caregiver inserts the tube in the inner part of the upper arm. The tube can remain in place for up to 3 years. After 3 years, the implant must be removed. The implant prevents the ovaries from releasing an egg (ovulation), thickens the cervical mucus which prevents sperm from entering the uterus, and thins the lining of the inside of the uterus.  Progesterone-only injections. These injections are given every 3 months by your caregiver to prevent pregnancy. This synthetic progesterone hormone stops the ovaries from releasing eggs. It also thickens cervical mucus and changes the uterine lining. This makes it harder for sperm to survive in the uterus.  Birth control pills. These pills contain estrogen and progesterone hormone. They work by stopping the egg from forming in the ovary (ovulation). Birth control pills are prescribed by a caregiver.Birth control pills can also be used to treat heavy periods.  Minipill. This type of birth control pill contains only the progesterone hormone. They are taken every day of each month and must be prescribed by your caregiver.  Birth control patch. The patch contains hormones similar to those in birth control pills. It must be changed once a week and is prescribed by a caregiver.  Vaginal ring. The ring contains hormones similar to those in birth control pills. It is left in the vagina for 3 weeks, removed for 1 week, and then a new one is put back in place. The patient must be comfortable inserting and removing the ring from the vagina.A caregiver's prescription is necessary.  Emergency contraception. Emergency contraceptives prevent pregnancy after unprotected  sexual intercourse. This pill can be taken right after sex or up to 5 days after unprotected sex. It is most effective the sooner you take the pills after having sexual intercourse. Emergency contraceptive pills are available without a prescription. Check with your pharmacist. Do not use emergency contraception as your only form of birth control. BARRIER METHODS   Female condom. This is a thin sheath (latex or rubber) that is worn over the penis during sexual intercourse. It can be used with spermicide to increase effectiveness.  Female condom. This is a soft, loose-fitting sheath that is put into the vagina before sexual intercourse.  Diaphragm. This is a soft, latex, dome-shaped barrier that must be fitted by a caregiver. It is inserted into the vagina, along with a spermicidal jelly. It is inserted before intercourse. The diaphragm should be left in the vagina for 6 to 8 hours after intercourse.  Cervical cap. This is a round, soft, latex or plastic cup that fits over the cervix and must be fitted by a caregiver. The cap can be left in place for up to 48 hours after intercourse.  Sponge. This is a soft, circular piece of polyurethane foam. The sponge has spermicide in it. It is inserted into the vagina after wetting it and before sexual intercourse.  Spermicides. These are chemicals that kill or block sperm from entering the cervix and uterus. They come in the form of creams, jellies, suppositories, foam, or tablets. They do not require a prescription. They are inserted into the vagina with an applicator before having sexual intercourse. The process must be repeated every time you have sexual intercourse. INTRAUTERINE CONTRACEPTION  Intrauterine device (  IUD). This is a T-shaped device that is put in a woman's uterus during a menstrual period to prevent pregnancy. There are 2 types:  Copper IUD. This type of IUD is wrapped in copper wire and is placed inside the uterus. Copper makes the uterus and  fallopian tubes produce a fluid that kills sperm. It can stay in place for 10 years.  Hormone IUD. This type of IUD contains the hormone progestin (synthetic progesterone). The hormone thickens the cervical mucus and prevents sperm from entering the uterus, and it also thins the uterine lining to prevent implantation of a fertilized egg. The hormone can weaken or kill the sperm that get into the uterus. It can stay in place for 5 years. PERMANENT METHODS OF CONTRACEPTION  Female tubal ligation. This is when the woman's fallopian tubes are surgically sealed, tied, or blocked to prevent the egg from traveling to the uterus.  Female sterilization. This is when the female has the tubes that carry sperm tied off (vasectomy).This blocks sperm from entering the vagina during sexual intercourse. After the procedure, the man can still ejaculate fluid (semen). NATURAL PLANNING METHODS  Natural family planning. This is not having sexual intercourse or using a barrier method (condom, diaphragm, cervical cap) on days the woman could become pregnant.  Calendar method. This is keeping track of the length of each menstrual cycle and identifying when you are fertile.  Ovulation method. This is avoiding sexual intercourse during ovulation.  Symptothermal method. This is avoiding sexual intercourse during ovulation, using a thermometer and ovulation symptoms.  Post-ovulation method. This is timing sexual intercourse after you have ovulated. Regardless of which type or method of contraception you choose, it is important that you use condoms to protect against the transmission of sexually transmitted diseases (STDs). Talk with your caregiver about which form of contraception is most appropriate for you. Document Released: 03/02/2005 Document Revised: 05/25/2011 Document Reviewed: 07/09/2010 [employer] Patient Information 2014 [employer].  Breastfeeding A change in hormones during your pregnancy causes growth of  your breast tissue and an increase in number and size of milk ducts. The hormone prolactin allows proteins, sugars, and fats from your blood supply to make breast milk in your milk-producing glands. The hormone progesterone prevents breast milk from being released before the birth of your baby. After the birth of your baby, your progesterone level decreases allowing breast milk to be released. Thoughts of your baby, as well as his or her sucking or crying, can stimulate the release of milk from the milk-producing glands. Deciding to breastfeed (nurse) is one of the best choices you can make for you and your baby. The information that follows gives a brief review of the benefits, as well as other important skills to know about breastfeeding. BENEFITS OF BREASTFEEDING For your baby  The first milk (colostrum) helps your baby's digestive system function better.   There are antibodies in your milk that help your baby fight off infections.   Your baby has a lower incidence of asthma, allergies, and sudden infant death syndrome (SIDS).   The nutrients in breast milk are better for your baby than infant formulas.  Breast milk improves your baby's brain development.   Your baby will have less gas, colic, and constipation.  Your baby is less likely to develop other conditions, such as childhood obesity, asthma, or diabetes mellitus. For you  Breastfeeding helps develop a very special bond between you and your baby.   Breastfeeding is convenient, always available at the correct   temperature, and costs nothing.   Breastfeeding helps to burn calories and helps you lose the weight gained during pregnancy.   Breastfeeding makes your uterus contract back down to normal size faster and slows bleeding following delivery.   Breastfeeding mothers have a lower risk of developing osteoporosis or breast or ovarian cancer later in life.  BREASTFEEDING FREQUENCY  A healthy, full-term baby may  breastfeed as often as every hour or space his or her feedings to every 3 hours. Breastfeeding frequency will vary from baby to baby.   Newborns should be fed no less than every 2 3 hours during the day and every 4 5 hours during the night. You should breastfeed a minimum of 8 feedings in a 24 hour period.  Awaken your baby to breastfeed if it has been 3 4 hours since the last feeding.  Breastfeed when you feel the need to reduce the fullness of your breasts or when your newborn shows signs of hunger. Signs that your baby may be hungry include:  Increased alertness or activity.  Stretching.  Movement of the head from side to side.  Movement of the head and opening of the mouth when the corner of the mouth or cheek is stroked (rooting).  Increased sucking sounds, smacking lips, cooing, sighing, or squeaking.  Hand-to-mouth movements.  Increased sucking of fingers or hands.  Fussing.  Intermittent crying.  Signs of extreme hunger will require calming and consoling before you try to feed your baby. Signs of extreme hunger may include:  Restlessness.  A loud, strong cry.  Screaming.  Frequent feeding will help you make more milk and will help prevent problems, such as sore nipples and engorgement of the breasts.  BREASTFEEDING   Whether lying down or sitting, be sure that the baby's abdomen is facing your abdomen.   Support your breast with 4 fingers under your breast and your thumb above your nipple. Make sure your fingers are well away from your nipple and your baby's mouth.   Stroke your baby's lips gently with your finger or nipple.   When your baby's mouth is open wide enough, place all of your nipple and as much of the colored area around your nipple (areola) as possible into your baby's mouth.  More areola should be visible above his or her upper lip than below his or her lower lip.  Your baby's tongue should be between his or her lower gum and your  breast.  Ensure that your baby's mouth is correctly positioned around the nipple (latched). Your baby's lips should create a seal on your breast.  Signs that your baby has effectively latched onto your nipple include:  Tugging or sucking without pain.  Swallowing heard between sucks.  Absent click or smacking sound.  Muscle movement above and in front of his or her ears with sucking.  Your baby must suck about 2 3 minutes in order to get your milk. Allow your baby to feed on each breast as long as he or she wants. Nurse your baby until he or she unlatches or falls asleep at the first breast, then offer the second breast.  Signs that your baby is full and satisfied include:  A gradual decrease in the number of sucks or complete cessation of sucking.  Falling asleep.  Extension or relaxation of his or her body.  Retention of a small amount of milk in his or her mouth.  Letting go of your breast by himself or herself.  Signs of effective   breastfeeding in you include:  Breasts that have increased firmness, weight, and size prior to feeding.  Breasts that are softer after nursing.  Increased milk volume, as well as a change in milk consistency and color by the 5th day of breastfeeding.  Breast fullness relieved by breastfeeding.  Nipples are not sore, cracked, or bleeding.  If needed, break the suction by putting your finger into the corner of your baby's mouth and sliding your finger between his or her gums. Then, remove your breast from his or her mouth.  It is common for babies to spit up a small amount after a feeding.  Babies often swallow air during feeding. This can make babies fussy. Burping your baby between breasts can help with this.  Vitamin D supplements are recommended for babies who get only breast milk.  Avoid using a pacifier during your baby's first 4 6 weeks.  Avoid supplemental feedings of water, formula, or juice in place of breastfeeding. Breast milk  is all the food your baby needs. It is not necessary for your baby to have water or formula. Your breasts will make more milk if supplemental feedings are avoided during the early weeks. HOW TO TELL WHETHER YOUR BABY IS GETTING ENOUGH BREAST MILK Wondering whether or not your baby is getting enough milk is a common concern among mothers. You can be assured that your baby is getting enough milk if:   Your baby is actively sucking and you hear swallowing.   Your baby seems relaxed and satisfied after a feeding.   Your baby nurses at least 8 12 times in a 24 hour time period.  During the first 3 5 days of age:  Your baby is wetting at least 3 5 diapers in a 24 hour period. The urine should be clear and pale yellow.  Your baby is having at least 3 4 stools in a 24 hour period. The stool should be soft and yellow.  At 5 7 days of age, your baby is having at least 3 6 stools in a 24 hour period. The stool should be seedy and yellow by 5 days of age.  Your baby has a weight loss less than 7 10% during the first 3 days of age.  Your baby does not lose weight after 3 7 days of age.  Your baby gains 4 7 ounces each week after he or she is 4 days of age.  Your baby gains weight by 5 days of age and is back to birth weight within 2 weeks. ENGORGEMENT In the first week after your baby is born, you may experience extremely full breasts (engorgement). When engorged, your breasts may feel heavy, warm, or tender to the touch. Engorgement peaks within 24 48 hours after delivery of your baby.  Engorgement may be reduced by:  Continuing to breastfeed.  Increasing the frequency of breastfeeding.  Taking warm showers or applying warm, moist heat to your breasts just before each feeding. This increases circulation and helps the milk flow.   Gently massaging your breast before and during the feedings. With your fingertips, massage from your chest wall towards your nipple in a circular motion.    Ensuring that your baby empties at least one breast at every feeding. It also helps to start the next feeding on the opposite breast.   Expressing breast milk by hand or by using a breast pump to empty the breasts if your baby is sleepy, or not nursing well. You may also want to   express milk if you are returning to work oryou feel you are getting engorged.  Ensuring your baby is latched on and positioned properly while breastfeeding. If you follow these suggestions, your engorgement should improve in 24 48 hours. If you are still experiencing difficulty, call your lactation consultant or caregiver.  CARING FOR YOURSELF Take care of your breasts.  Bathe or shower daily.   Avoid using soap on your nipples.   Wear a supportive bra. Avoid wearing underwire style bras.  Air dry your nipples for a 3 4minutes after each feeding.   Use only cotton bra pads to absorb breast milk leakage. Leaking of breast milk between feedings is normal.   Use only pure lanolin on your nipples after nursing. You do not need to wash it off before feeding your baby again. Another option is to express a few drops of breast milk and gently massage that milk into your nipples.  Continue breast self-awareness checks. Take care of yourself.  Eat healthy foods. Alternate 3 meals with 3 snacks.  Avoid foods that you notice affect your baby in a bad way.  Drink milk, fruit juice, and water to satisfy your thirst (about 8 glasses a day).   Rest often, relax, and take your prenatal vitamins to prevent fatigue, stress, and anemia.  Avoid chewing and smoking tobacco.  Avoid alcohol and drug use.  Take over-the-counter and prescribed medicine only as directed by your caregiver or pharmacist. You should always check with your caregiver or pharmacist before taking any new medicine, vitamin, or herbal supplement.  Know that pregnancy is possible while breastfeeding. If desired, talk to your caregiver about  family planning and safe birth control methods that may be used while breastfeeding. SEEK MEDICAL CARE IF:   You feel like you want to stop breastfeeding or have become frustrated with breastfeeding.  You have painful breasts or nipples.  Your nipples are cracked or bleeding.  Your breasts are red, tender, or warm.  You have a swollen area on either breast.  You have a fever or chills.  You have nausea or vomiting.  You have drainage from your nipples.  Your breasts do not become full before feedings by the 5th day after delivery.  You feel sad and depressed.  Your baby is too sleepy to eat well.  Your baby is having trouble sleeping.   Your baby is wetting less than 3 diapers in a 24 hour period.  Your baby has less than 3 stools in a 24 hour period.  Your baby's skin or the white part of his or her eyes becomes more yellow.   Your baby is not gaining weight by 5 days of age. MAKE SURE YOU:   Understand these instructions.  Will watch your condition.  Will get help right away if you are not doing well or get worse. Document Released: 03/02/2005 Document Revised: 11/25/2011 Document Reviewed: 10/07/2011 [employer] Patient Information 2014 [employer].  

## 2012-10-26 ENCOUNTER — Inpatient Hospital Stay (HOSPITAL_COMMUNITY)
Admission: RE | Admit: 2012-10-26 | Discharge: 2012-10-29 | DRG: 775 | Disposition: A | Discharge status: Home / Self Care | Payer: Medicaid Other | Source: Ambulatory Visit | Attending: Obstetrics & Gynecology | Admitting: Obstetrics & Gynecology

## 2012-10-26 ENCOUNTER — Inpatient Hospital Stay (HOSPITAL_COMMUNITY): Payer: Medicaid Other | Admitting: Anesthesiology

## 2012-10-26 ENCOUNTER — Encounter (HOSPITAL_COMMUNITY): Payer: Self-pay | Admitting: Anesthesiology

## 2012-10-26 ENCOUNTER — Encounter (HOSPITAL_COMMUNITY): Payer: Self-pay

## 2012-10-26 VITALS — BP 122/78 | HR 66 | Temp 98.2°F | Resp 18 | Ht 64.0 in | Wt 205.0 lb

## 2012-10-26 DIAGNOSIS — O480 Post-term pregnancy: Principal | ICD-10-CM | POA: Diagnosis present

## 2012-10-26 DIAGNOSIS — O99810 Abnormal glucose complicating pregnancy: Secondary | ICD-10-CM

## 2012-10-26 DIAGNOSIS — Z23 Encounter for immunization: Secondary | ICD-10-CM

## 2012-10-26 HISTORY — DX: Supervision of pregnancy with insufficient antenatal care, unspecified trimester: O09.30

## 2012-10-26 HISTORY — DX: Other specified bacterial agents as the cause of diseases classified elsewhere: B96.89

## 2012-10-26 HISTORY — DX: Acute vaginitis: N76.0

## 2012-10-26 LAB — OB RESULTS CONSOLE RUBELLA ANTIBODY, IGM
Rubella: IMMUNE
Rubella: NON-IMMUNE/NOT IMMUNE
Rubella: NON-IMMUNE/NOT IMMUNE

## 2012-10-26 LAB — TYPE AND SCREEN: Antibody Screen: NEGATIVE

## 2012-10-26 LAB — CBC
HCT: 32.7 % — ABNORMAL LOW (ref 36.0–46.0)
MCV: 98.2 fL (ref 78.0–100.0)
RBC: 3.33 MIL/uL — ABNORMAL LOW (ref 3.87–5.11)
WBC: 8.3 10*3/uL (ref 4.0–10.5)

## 2012-10-26 MED ORDER — OXYTOCIN BOLUS FROM INFUSION
500.0000 mL | INTRAVENOUS | Status: DC
  Administered 2012-10-27: 500 mL via INTRAVENOUS

## 2012-10-26 MED ORDER — CITRIC ACID-SODIUM CITRATE 334-500 MG/5ML PO SOLN: 30.0000 mL | ORAL | Status: DC | PRN

## 2012-10-26 MED ORDER — FLEET ENEMA 7-19 GM/118ML RE ENEM: 1.0000 | ENEMA | RECTAL | Status: DC | PRN

## 2012-10-26 MED ORDER — IBUPROFEN 600 MG PO TABS
600.0000 mg | ORAL_TABLET | Freq: Four times a day (QID) | ORAL | Status: DC | PRN
  Filled 2012-10-26 (×6): qty 1

## 2012-10-26 MED ORDER — FENTANYL 2.5 MCG/ML BUPIVACAINE 1/10 % EPIDURAL INFUSION (WH - ANES)
14.0000 mL/h | INTRAMUSCULAR | Status: DC | PRN
  Filled 2012-10-26: qty 125

## 2012-10-26 MED ORDER — LIDOCAINE HCL (PF) 1 % IJ SOLN
30.0000 mL | INTRAMUSCULAR | Status: DC | PRN
  Filled 2012-10-26 (×2): qty 30

## 2012-10-26 MED ORDER — OXYTOCIN 40 UNITS IN LACTATED RINGERS INFUSION - SIMPLE MED: 62.5000 mL/h | INTRAVENOUS | Status: DC

## 2012-10-26 MED ORDER — OXYCODONE-ACETAMINOPHEN 5-325 MG PO TABS
1.0000 | ORAL_TABLET | ORAL | Status: DC | PRN
  Administered 2012-10-27 – 2012-10-28 (×4): 1 via ORAL
  Filled 2012-10-26 (×2): qty 1
  Filled 2012-10-26: qty 2
  Filled 2012-10-26 (×2): qty 1

## 2012-10-26 MED ORDER — FENTANYL 2.5 MCG/ML BUPIVACAINE 1/10 % EPIDURAL INFUSION (WH - ANES)
INTRAMUSCULAR | Status: DC | PRN
  Administered 2012-10-26: 14 mL/h via EPIDURAL

## 2012-10-26 MED ORDER — EPHEDRINE 5 MG/ML INJ
10.0000 mg | INTRAVENOUS | Status: DC | PRN
  Filled 2012-10-26: qty 2

## 2012-10-26 MED ORDER — LIDOCAINE HCL (PF) 1 % IJ SOLN
INTRAMUSCULAR | Status: DC | PRN
  Administered 2012-10-26 (×2): 4 mL

## 2012-10-26 MED ORDER — ACETAMINOPHEN 325 MG PO TABS: 650.0000 mg | ORAL_TABLET | ORAL | Status: DC | PRN

## 2012-10-26 MED ORDER — FENTANYL CITRATE 0.05 MG/ML IJ SOLN
100.0000 ug | INTRAMUSCULAR | Status: DC | PRN
  Administered 2012-10-26 (×2): 100 ug via INTRAVENOUS
  Filled 2012-10-26 (×2): qty 2

## 2012-10-26 MED ORDER — PHENYLEPHRINE 40 MCG/ML (10ML) SYRINGE FOR IV PUSH (FOR BLOOD PRESSURE SUPPORT)
80.0000 ug | PREFILLED_SYRINGE | INTRAVENOUS | Status: DC | PRN
  Filled 2012-10-26: qty 2
  Filled 2012-10-26: qty 5

## 2012-10-26 MED ORDER — DIPHENHYDRAMINE HCL 50 MG/ML IJ SOLN
12.5000 mg | INTRAMUSCULAR | Status: DC | PRN
  Administered 2012-10-26: 12.5 mg via INTRAVENOUS
  Filled 2012-10-26: qty 1

## 2012-10-26 MED ORDER — ONDANSETRON HCL 4 MG/2ML IJ SOLN: 4.0000 mg | Freq: Four times a day (QID) | INTRAMUSCULAR | Status: DC | PRN

## 2012-10-26 MED ORDER — PHENYLEPHRINE 40 MCG/ML (10ML) SYRINGE FOR IV PUSH (FOR BLOOD PRESSURE SUPPORT)
80.0000 ug | PREFILLED_SYRINGE | INTRAVENOUS | Status: DC | PRN
  Filled 2012-10-26: qty 2

## 2012-10-26 MED ORDER — EPHEDRINE 5 MG/ML INJ
10.0000 mg | INTRAVENOUS | Status: DC | PRN
  Filled 2012-10-26: qty 4
  Filled 2012-10-26: qty 2

## 2012-10-26 MED ORDER — LACTATED RINGERS IV SOLN
500.0000 mL | Freq: Once | INTRAVENOUS | Status: AC
  Administered 2012-10-26: 500 mL via INTRAVENOUS

## 2012-10-26 MED ORDER — LACTATED RINGERS IV SOLN
500.0000 mL | INTRAVENOUS | Status: DC | PRN
  Administered 2012-10-27: 500 mL via INTRAVENOUS

## 2012-10-26 MED ORDER — LACTATED RINGERS IV SOLN
INTRAVENOUS | Status: DC
  Administered 2012-10-26 (×3): via INTRAVENOUS

## 2012-10-26 MED ORDER — OXYTOCIN 40 UNITS IN LACTATED RINGERS INFUSION - SIMPLE MED
1.0000 m[IU]/min | INTRAVENOUS | Status: DC
  Administered 2012-10-26: 2 m[IU]/min via INTRAVENOUS
  Filled 2012-10-26: qty 1000

## 2012-10-26 MED ORDER — TERBUTALINE SULFATE 1 MG/ML IJ SOLN: 0.2500 mg | Freq: Once | INTRAMUSCULAR | Status: AC | PRN

## 2012-10-26 NOTE — Progress Notes (Signed)
Patient ID: Ellen Hall, female   DOB: [birth year], 27 y.o.   MRN: [id number] LABOR PROGRESS NOTE  Ellen Hall is a 27 y.o. [phone] at [redacted]w[redacted]d  admitted for induction of labor due to postdates.  Subjective:  Would like an epidural. No lof, vb. +FM  Objective: BP 124/78  Pulse 64  Temp(Src) 97.8 F (36.6 C) (Oral)  Resp 18  Ht 5\' 4"  (1.626 m)  Wt 92.987 kg (205 lb)  BMI 35.17 kg/m2  SpO2 100%    FHT:  FHR: 125 bpm, variability: min to moderate,  accelerations:  Present,  decelerations:  Absent UC:   regular, every 2-3 minutes SVE:   Dilation: 5 Effacement (%): 80 Station: -2 Exam by:: e. poore, rn   Labs: Lab Results  Component Value Date   WBC 8.3 10/26/2012   HGB 10.9* 10/26/2012   HCT 32.7* 10/26/2012   MCV 98.2 10/26/2012   PLT 177 10/26/2012    Assessment / Plan: iol for post dates  Labor: cont pitocin. will plan on AROM soon Fetal Wellbeing:  Category I Pain Control:  Epidural Anticipated MOD:  NSVD  Merrily Tegeler L, MD 10/26/2012, 9:08 PM

## 2012-10-26 NOTE — Anesthesia Procedure Notes (Signed)
Epidural Patient location during procedure: OB Start time: 10/26/2012 8:14 PM  Staffing Anesthesiologist: Jovi Alvizo A. Performed by: anesthesiologist   Preanesthetic Checklist Completed: patient identified, site marked, surgical consent, pre-op evaluation, timeout performed, IV checked, risks and benefits discussed and monitors and equipment checked  Epidural Patient position: sitting Prep: site prepped and draped and DuraPrep Patient monitoring: continuous pulse ox and blood pressure Approach: midline Injection technique: LOR air  Needle:  Needle type: Tuohy  Needle gauge: 17 G Needle length: 9 cm and 9 Needle insertion depth: 7 cm Catheter type: closed end flexible Catheter size: 19 Gauge Catheter at skin depth: 12 cm Test dose: negative and Other  Assessment Events: blood not aspirated, injection not painful, no injection resistance, negative IV test and no paresthesia  Additional Notes Patient identified. Risks and benefits discussed including failed block, incomplete  Pain control, post dural puncture headache, nerve damage, paralysis, blood pressure Changes, nausea, vomiting, reactions to medications-both toxic and allergic and post Partum back pain. All questions were answered. Patient expressed understanding and wished to proceed. Sterile technique was used throughout procedure. Epidural site was Dressed with sterile barrier dressing. No paresthesias, signs of intravascular injection Or signs of intrathecal spread were encountered.  Patient was more comfortable after the epidural was dosed. Please see RN's note for documentation of vital signs and FHR which are stable.

## 2012-10-26 NOTE — Progress Notes (Signed)
CARMELL ELGIN is a 27 y.o. [phone] at [redacted]w[redacted]d admitted for induction of labor due to Post dates. Due date 10/18/2012  Subjective: Patient comfortable with epidural  Objective: BP 102/84  Pulse 77  Temp(Src) 97.8 F (36.6 C) (Oral)  Resp 18  Ht 5\' 4"  (1.626 m)  Wt 92.987 kg (205 lb)  BMI 35.17 kg/m2  SpO2 100%      FHT:  FHR: 120 bpm, variability: moderate,  accelerations:  Present,  decelerations:  Absent UC:   regular, every 2 minutes SVE:   Dilation: 5 Effacement (%): 80 Station: -2 Exam by:: e. poore, rn  Labs: Lab Results  Component Value Date   WBC 8.3 10/26/2012   HGB 10.9* 10/26/2012   HCT 32.7* 10/26/2012   MCV 98.2 10/26/2012   PLT 177 10/26/2012    Assessment / Plan: Induction of labor due to postterm,  progressing well on pitocin Will AROM on next check  Labor: Progressing on Pitocin, will continue to increase then AROM Fetal Wellbeing:  Category I Pain Control:  Epidural I/D:  n/a Anticipated MOD:  NSVD  [name] 10/26/2012, 10:30 PM

## 2012-10-26 NOTE — H&P (Signed)
Ellen Hall is a 27 y.o. female [phone] with IUP at [redacted]w[redacted]d presenting for post dates IOL. Pt states she has been having irregular contractions, associated with no vaginal bleeding.  Membranes are intact, with active fetal movement.     [facility] since 25 wks. Dated by 31 week [place].  Had an abnormal 1 hour to 153 but never got a 3 hour.  Has not been checking sugars but a random cbg was 83 in clinic.    Prenatal History/Complications:  Past Medical History: Past Medical History  Diagnosis Date  . Medical history non-contributory     Past Surgical History: Past Surgical History  Procedure Laterality Date  . No past surgeries      Obstetrical History: OB History   Grav Para Term Preterm Abortions TAB SAB Ect Mult Living   4 3 3       3      G1-G3- all postdates inductions, NSVD, no complications. Largest baby 8lb15oz.    Social History: History   Social History  . Marital Status: Single    Spouse Name: N/A    Number of Children: N/A  . Years of Education: N/A   Social History Main Topics  . Smoking status: Former Games developer  . Smokeless tobacco: Never Used  . Alcohol Use: No  . Drug Use: No     Comment: 1 month ago  . Sexual Activity: Not Currently   Other Topics Concern  . None   Social History Narrative  . None    Family History: Family History  Problem Relation Age of Onset  . Diabetes Mother   . Diabetes Sister   . Diabetes Maternal Grandmother     Allergies: No Known Allergies  Prescriptions prior to admission  Medication Sig Dispense Refill  . prenatal vitamin w/FE, FA (PRENATAL 1 + 1) 27-1 MG TABS Take 1 tablet by mouth daily.  30 each  0     Review of Systems   Constitutional: Negative for fever, chills, weight loss, malaise/fatigue and diaphoresis.  HENT: Negative for hearing loss, ear pain, nosebleeds, congestion, sore throat, neck pain, tinnitus and ear discharge.   Eyes: Negative for blurred vision, double vision, photophobia, pain,  discharge and redness.  Respiratory: Negative for cough, hemoptysis, sputum production, shortness of breath, wheezing and stridor.   Cardiovascular: Negative for chest pain, palpitations, orthopnea,  leg swelling  Gastrointestinal: Negative for heartburn, nausea, vomiting, diarrhea, constipation, blood in stool Genitourinary: Negative for dysuria, urgency, frequency, hematuria and flank pain.  Musculoskeletal: Negative for myalgias, back pain, joint pain and falls.  Skin: Negative for itching and rash.  Neurological: Negative for dizziness, tingling, tremors, sensory change, speech change, focal weakness, seizures, loss of consciousness, weakness and headaches.  Endo/Heme/Allergies: Negative for environmental allergies and polydipsia. Does not bruise/bleed easily.  Psychiatric/Behavioral: Negative for depression, suicidal ideas, hallucinations, memory loss and substance abuse. The patient is not nervous/anxious and does not have insomnia.       Blood pressure 119/67, pulse 80, temperature 97.8 F (36.6 C), temperature source Oral, resp. rate 20, height 5\' 4"  (1.626 m), weight 92.987 kg (205 lb). General appearance: alert, cooperative and no distress Lungs: clear to auscultation bilaterally Heart: regular rate and rhythm Abdomen: soft, non-tender; bowel sounds normal Pelvic: adequate Extremities: Homans sign is negative, no sign of DVT Presentation: cephalic Fetal monitoringBaseline: 125 bpm, Variability: Good {> 6 bpm), Accelerations: Reactive and Decelerations: Absent Uterine activity occasional   Dilation: 2 Effacement (%): 50 Station: -2 Exam by:: Dr.  Khari Mally   Prenatal labs: ABO, Rh: O/POS/-- (06/25 1237) Antibody: NEG (06/25 1237) Rubella:   RPR: NON REAC (06/25 1237)  HBsAg: NEGATIVE (06/25 1237)  HIV: NON REACTIVE (06/25 1237)  GBS: Negative (08/08 0000)  1 hr Glucola 153 Genetic screening  Too late Anatomy US normal   Assessment: Ellen Hall is a 27 y.o. [phone]  with an IUP at [redacted]w[redacted]d presenting for post dates IOL.    Plan: IOL - admit to L&D - routine orders - foley bulb placed and inflated with 60cc of LR without difficulty - pit started per protocol   FWB - cat I tracing - GBS neg  Hx of elevated 1 hour - will check CBGs and if abnormal, cont to check them   Anticipate SVD   Averlee Swartz L, MD 10/26/2012, 2:36 PM

## 2012-10-26 NOTE — Anesthesia Preprocedure Evaluation (Signed)
Anesthesia Evaluation  Patient identified by MRN, date of birth, ID band Patient awake    Reviewed: Allergy & Precautions, H&P , Patient's Chart, lab work & pertinent test results  Airway Mallampati: II TM Distance: >3 FB Neck ROM: full    Dental no notable dental hx. (+) Teeth Intact   Pulmonary former smoker,  breath sounds clear to auscultation  Pulmonary exam normal       Cardiovascular negative cardio ROS  Rhythm:regular Rate:Normal     Neuro/Psych negative neurological ROS  negative psych ROS   GI/Hepatic negative GI ROS, Neg liver ROS,   Endo/Other  Obesity   Renal/GU negative Renal ROS  negative genitourinary   Musculoskeletal   Abdominal Normal abdominal exam  (+)   Peds  Hematology negative hematology ROS (+)   Anesthesia Other Findings   Reproductive/Obstetrics (+) Pregnancy                           Anesthesia Physical Anesthesia Plan  ASA: II  Anesthesia Plan: Epidural   Post-op Pain Management:    Induction:   Airway Management Planned:   Additional Equipment:   Intra-op Plan:   Post-operative Plan:   Informed Consent: I have reviewed the patients History and Physical, chart, labs and discussed the procedure including the risks, benefits and alternatives for the proposed anesthesia with the patient or authorized representative who has indicated his/her understanding and acceptance.     Plan Discussed with: Anesthesiologist  Anesthesia Plan Comments:         Anesthesia Quick Evaluation

## 2012-10-27 ENCOUNTER — Encounter (HOSPITAL_COMMUNITY): Payer: Self-pay

## 2012-10-27 DIAGNOSIS — O480 Post-term pregnancy: Secondary | ICD-10-CM

## 2012-10-27 MED ORDER — TETANUS-DIPHTH-ACELL PERTUSSIS 5-2.5-18.5 LF-MCG/0.5 IM SUSP
0.5000 mL | Freq: Once | INTRAMUSCULAR | Status: AC
  Administered 2012-10-27: 0.5 mL via INTRAMUSCULAR
  Filled 2012-10-27: qty 0.5

## 2012-10-27 MED ORDER — ZOLPIDEM TARTRATE 5 MG PO TABS: 5.0000 mg | ORAL_TABLET | Freq: Every evening | ORAL | Status: DC | PRN

## 2012-10-27 MED ORDER — WITCH HAZEL-GLYCERIN EX PADS: 1.0000 "application " | MEDICATED_PAD | CUTANEOUS | Status: DC | PRN

## 2012-10-27 MED ORDER — DIBUCAINE 1 % RE OINT: 1.0000 "application " | TOPICAL_OINTMENT | RECTAL | Status: DC | PRN

## 2012-10-27 MED ORDER — ONDANSETRON HCL 4 MG/2ML IJ SOLN: 4.0000 mg | INTRAMUSCULAR | Status: DC | PRN

## 2012-10-27 MED ORDER — DIPHENHYDRAMINE HCL 25 MG PO CAPS: 25.0000 mg | ORAL_CAPSULE | Freq: Four times a day (QID) | ORAL | Status: DC | PRN

## 2012-10-27 MED ORDER — BENZOCAINE-MENTHOL 20-0.5 % EX AERO: 1.0000 "application " | INHALATION_SPRAY | CUTANEOUS | Status: DC | PRN

## 2012-10-27 MED ORDER — OXYCODONE-ACETAMINOPHEN 5-325 MG PO TABS
1.0000 | ORAL_TABLET | ORAL | Status: DC | PRN
  Administered 2012-10-29: 1 via ORAL

## 2012-10-27 MED ORDER — LANOLIN HYDROUS EX OINT: TOPICAL_OINTMENT | CUTANEOUS | Status: DC | PRN

## 2012-10-27 MED ORDER — PRENATAL MULTIVITAMIN CH
1.0000 | ORAL_TABLET | Freq: Every day | ORAL | Status: DC
  Administered 2012-10-27 – 2012-10-29 (×3): 1 via ORAL
  Filled 2012-10-27 (×3): qty 1

## 2012-10-27 MED ORDER — PNEUMOCOCCAL VAC POLYVALENT 25 MCG/0.5ML IJ INJ
0.5000 mL | INJECTION | INTRAMUSCULAR | Status: AC
  Filled 2012-10-27: qty 0.5

## 2012-10-27 MED ORDER — SIMETHICONE 80 MG PO CHEW: 80.0000 mg | CHEWABLE_TABLET | ORAL | Status: DC | PRN

## 2012-10-27 MED ORDER — ONDANSETRON HCL 4 MG PO TABS: 4.0000 mg | ORAL_TABLET | ORAL | Status: DC | PRN

## 2012-10-27 MED ORDER — IBUPROFEN 600 MG PO TABS
600.0000 mg | ORAL_TABLET | Freq: Four times a day (QID) | ORAL | Status: DC
  Administered 2012-10-27 – 2012-10-29 (×10): 600 mg via ORAL
  Filled 2012-10-27 (×4): qty 1

## 2012-10-27 MED ORDER — SENNOSIDES-DOCUSATE SODIUM 8.6-50 MG PO TABS
2.0000 | ORAL_TABLET | Freq: Every day | ORAL | Status: DC
  Administered 2012-10-27 – 2012-10-29 (×2): 2 via ORAL

## 2012-10-27 NOTE — H&P (Signed)
Attestation of Attending Supervision of Obstetric Fellow: Evaluation and management procedures were performed by the Obstetric Fellow under my supervision and collaboration.  I have reviewed the Obstetric Fellow's note and chart, and I agree with the management and plan.  UGONNA  ANYANWU, MD, FACOG Attending Obstetrician & Gynecologist Faculty Practice, [facility]   

## 2012-10-27 NOTE — Progress Notes (Signed)
UR chart review completed.  

## 2012-10-27 NOTE — Plan of Care (Signed)
Problem: Phase II Progression Outcomes Goal: Initiate breastfeeding within 1hr delivery Outcome: Not Applicable Date Met:  10/27/12 Pt's choice to formula feed.

## 2012-10-27 NOTE — Progress Notes (Signed)
Ellen Hall is a 27 y.o. [phone] at [redacted]w[redacted]d by  admitted for induction of labor due to Post dates.  Subjective: Patient comfortable with epidural  Objective: BP 118/62  Pulse 75  Temp(Src) 99.3 F (37.4 C) (Oral)  Resp 18  Ht 5\' 4"  (1.626 m)  Wt 92.987 kg (205 lb)  BMI 35.17 kg/m2  SpO2 100%      FHT:  FHR: 135 bpm, variability: moderate,  accelerations:  Present,  decelerations:  Present variables UC:   regular, every 2 minutes SVE:   Dilation: 9 Effacement (%): 90 Station: -1 Exam by:: dr Waynetta Sandy  Labs: Lab Results  Component Value Date   WBC 8.3 10/26/2012   HGB 10.9* 10/26/2012   HCT 32.7* 10/26/2012   MCV 98.2 10/26/2012   PLT 177 10/26/2012    Assessment / Plan: Induction of labor due to postterm,  progressing well on pitocin AROM at this time, clear fluid  Labor: active labor Fetal Wellbeing:  Category II Pain Control:  Epidural I/D:  n/a Anticipated MOD:  NSVD  Ellen Hall 10/27/2012, 3:25 AM

## 2012-10-27 NOTE — Anesthesia Postprocedure Evaluation (Signed)
  Anesthesia Post-op Note  Patient: Ellen Hall  Procedure(s) Performed: * No procedures listed *  Patient Location: PACU and Mother/Baby  Anesthesia Type:Epidural  Level of Consciousness: awake, alert  and oriented  Airway and Oxygen Therapy: Patient Spontanous Breathing  Post-op Pain: none  Post-op Assessment: Post-op Vital signs reviewed, Patient's Cardiovascular Status Stable, No headache, No backache, No residual numbness and No residual motor weakness  Post-op Vital Signs: Reviewed and stable  Complications: No apparent anesthesia complications

## 2012-10-28 MED ORDER — MEASLES, MUMPS & RUBELLA VAC ~~LOC~~ INJ
0.5000 mL | INJECTION | Freq: Once | SUBCUTANEOUS | Status: DC
  Filled 2012-10-28: qty 0.5

## 2012-10-28 MED ORDER — MEASLES, MUMPS & RUBELLA VAC ~~LOC~~ INJ
0.5000 mL | INJECTION | Freq: Once | SUBCUTANEOUS | Status: AC
  Administered 2012-10-29: 0.5 mL via SUBCUTANEOUS
  Filled 2012-10-28 (×2): qty 0.5

## 2012-10-28 NOTE — Clinical Social Work Maternal (Signed)
    Clinical Social Work Department PSYCHOSOCIAL ASSESSMENT - MATERNAL/CHILD 10/28/2012  Patient:  Ellen Hall, Ellen Hall  Account Number:  [id number]  Admit Date:  10/26/2012  Ellen Hall Name:   Ellen Hall    Clinical Social Worker:  Nobie Putnam, LCSW   Date/Time:  10/28/2012 03:29 PM  Date Referred:  10/28/2012   Referral source  CN     Referred reason  Substance Abuse   Other referral source:    I:  FAMILY / HOME ENVIRONMENT Child's legal guardian:  PARENT  Guardian - Name Guardian - Age Guardian - Address  Ellen Hall [address] [place], [place] [address]  Ellen Hall 27    Other household support members/support persons Name Relationship DOB   DAUGHTER 2007   SON 2008   Other support:   Ellen Hall, pt's mother    II  PSYCHOSOCIAL DATA Information Source:    Event organiser Employment:   Financial resources:  Self Pay If Medicaid - [employer]:   Building services engineer / Grade:   Maternity Care Coordinator / Child Services Coordination / Early Interventions:  Cultural issues impacting care:    III  STRENGTHS Strengths  Adequate Resources  Home prepared for Child (including basic supplies)  Supportive family/friends   Strength comment:    IV  RISK FACTORS AND CURRENT PROBLEMS Current Problem:  YES   Risk Factor & Current Problem Patient Issue Family Issue Risk Factor / Current Problem Comment  Substance Abuse Y N Hx of MJ use    V  SOCIAL WORK ASSESSMENT CSW referral received to assess pt's history of MJ use.  Pt admits to smoking MJ "everyday," prior to pregnancy confirmation at 5 months.  Once pregnancy was confirmed, she continued to smoke MJ until her 7 month of pregnancy. She denies other illegal substance use & verbalized understanding of hospital drug testing policy.  Pt is familiar with policy because her son tested positive for MJ at delivery.  CPS was involved.  UDS is negative, meconium results are pending.  Pt has  all the necessary supplies for the infant.  She identified her mother & FOB as her primary support system.  FOB at the bedside, bonding with the baby. CSW will continue to monitor drug screen results & make a referral if needed.      VI SOCIAL WORK PLAN Social Work Plan  No Further Intervention Required / No Barriers to Discharge   Type of pt/family education:   If child protective services report - county:   If child protective services report - date:   Information/referral to community resources comment:   Other social work plan:

## 2012-10-28 NOTE — Discharge Summary (Cosign Needed)
Obstetric Discharge Summary Reason for Admission: induction of labor and post dates Prenatal Procedures: none Intrapartum Procedures: spontaneous vaginal delivery Postpartum Procedures: none Complications-Operative and Postpartum: none Hemoglobin  Date Value Range Status  10/26/2012 10.9* 12.0 - 15.0 g/dL Final     HCT  Date Value Range Status  10/26/2012 32.7* 36.0 - 46.0 % Final    Physical Exam:  General: alert, cooperative and no distress Lochia: appropriate Uterine Fundus: firm Incision: NA DVT Evaluation: No evidence of DVT seen on physical exam. Negative Homan's sign. No cords or calf tenderness. No significant calf/ankle edema.  Discharge Diagnoses: Post-date pregnancy  Discharge Information: Date: 10/29/2012 Activity: pelvic rest Diet: routine Medications: Ibuprofen and Percocet Condition: stable Instructions: refer to practice specific booklet Discharge to: home Follow-up Information   Schedule an appointment as soon as possible for a visit with [facility]. (call and make appt for depo next week and 4-6 week f/u for mirena placement. Message sent and they may call you as well.)    Specialty:  Obstetrics and Gynecology   Contact information:   [address] [place] [address] [phone]    MOC: depo/mirena MOF: bottle  Newborn Data: Live born female  Birth Weight: 7 lb 14.3 oz (3580 g) APGAR: 7, 9  Home with mother.  Bottle feeding.  Has 4week post-natal visit.  Depo for birth control and mirena t follow up.  Tawana Scale 10/29/2012, 9:38 AM  I spoke with and examined patient and agree with PA-S's note and plan of care.  Tawana Scale, MD Ob Fellow 10/29/2012 9:38 AM

## 2012-10-29 MED ORDER — MEDROXYPROGESTERONE ACETATE 150 MG/ML IM SUSP: 150.0000 mg | INTRAMUSCULAR | Status: DC

## 2012-12-07 ENCOUNTER — Ambulatory Visit: Payer: Self-pay | Admitting: Obstetrics & Gynecology

## 2013-01-09 ENCOUNTER — Ambulatory Visit: Payer: Medicaid Other | Admitting: Advanced Practice Midwife

## 2013-01-18 ENCOUNTER — Encounter: Payer: Self-pay | Admitting: *Deleted

## 2014-01-15 ENCOUNTER — Encounter (HOSPITAL_COMMUNITY): Payer: Self-pay

## 2014-02-01 ENCOUNTER — Encounter (HOSPITAL_COMMUNITY): Payer: Self-pay | Admitting: Emergency Medicine

## 2014-02-01 ENCOUNTER — Emergency Department (HOSPITAL_COMMUNITY)
Admission: EM | Admit: 2014-02-01 | Discharge: 2014-02-01 | Disposition: A | Discharge status: Home / Self Care | Payer: Medicaid Other | Attending: Emergency Medicine | Admitting: Emergency Medicine

## 2014-02-01 DIAGNOSIS — L03012 Cellulitis of left finger: Secondary | ICD-10-CM | POA: Insufficient documentation

## 2014-02-01 DIAGNOSIS — Z87891 Personal history of nicotine dependence: Secondary | ICD-10-CM | POA: Insufficient documentation

## 2014-02-01 DIAGNOSIS — Z79899 Other long term (current) drug therapy: Secondary | ICD-10-CM | POA: Insufficient documentation

## 2014-02-01 MED ORDER — CEPHALEXIN 250 MG PO CAPS
500.0000 mg | ORAL_CAPSULE | Freq: Once | ORAL | Status: AC
Start: 2014-02-01 — End: 2014-02-01
  Administered 2014-02-01: 500 mg via ORAL
  Filled 2014-02-01: qty 2

## 2014-02-01 MED ORDER — ACETAMINOPHEN 500 MG PO TABS
1000.0000 mg | ORAL_TABLET | Freq: Once | ORAL | Status: AC
  Administered 2014-02-01: 1000 mg via ORAL
  Filled 2014-02-01: qty 2

## 2014-02-01 MED ORDER — CEPHALEXIN 500 MG PO CAPS: 500.0000 mg | ORAL_CAPSULE | Freq: Four times a day (QID) | ORAL | Status: DC

## 2014-02-01 NOTE — ED Provider Notes (Signed)
CSN: [id number]     Arrival date & time 02/01/14  1041 History  This chart was scribed for non-physician practitioner, Wynetta EmeryNicole Adreona Brand, PA-C, working with Tilden FossaElizabeth Rees, MD, by Ronney LionSuzanne Le, ED Scribe. This patient was seen in room TR05C/TR05C and the patient's care was started at 12:52 PM.    Chief Complaint  Patient presents with  . Finger Injury   The history is provided by the patient. No language interpreter was used.    HPI Comments: Ellen Hall is an approximately 6050-month pregnant 28 y.o. female who presents to the Emergency Department complaining of left ring finger pain that began 2 days ago. Pain is moderate, 6 out of 10 and exacerbated made by movement and palpation. No pain medication taken prior to arrival. No prior similar episodes. 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 She doesn't currently see an OB/GYN. Denies vaginal discharge, abdominal pain, dysuria, hematuria.  Past Medical History  Diagnosis Date  . BV (bacterial vaginosis)   . Late prenatal care    Past Surgical History  Procedure Laterality Date  . No past surgeries     Family History  Problem Relation Age of Onset  . Diabetes Mother   . Diabetes Sister   . Diabetes Maternal Grandmother    History  Substance Use Topics  . Smoking status: Former Games developermoker  . Smokeless tobacco: Never Used  . Alcohol Use: No   OB History    Gravida Para Term Preterm AB TAB SAB Ectopic Multiple Living   4 4 4       4      Review of Systems  All other systems reviewed and are negative.   A complete 10 system review of systems was obtained and all systems are negative except as noted in the HPI and PMH.    Allergies  Review of patient's allergies indicates no known allergies.  Home Medications   Prior to Admission medications   Medication Sig Start Date End Date Taking? Authorizing Provider  cephALEXin (KEFLEX) 500 MG capsule Take 1 capsule (500 mg total) by mouth 4 (four) times daily.  02/01/14   Zitlali Primm, PA-C  medroxyPROGESTERone (DEPO-PROVERA) 150 MG/ML injection Inject 1 mL (150 mg total) into the muscle every 3 (three) months. At clinic 10/29/12   Marge DuncansKimberly Randall Booker, CNM  prenatal vitamin w/FE, FA (PRENATAL 1 + 1) 27-1 MG TABS Take 1 tablet by mouth daily. 08/16/12   Deirdre C Poe, CNM   BP 110/65 mmHg  Pulse 64  Temp(Src) 97.8 F (36.6 C) (Oral)  Resp 18  Ht 5\' 3"  (1.6 m)  Wt 185 lb (83.915 kg)  BMI 32.78 kg/m2  SpO2 98% Physical Exam  Constitutional: She is oriented to person, place, and time. She appears well-developed and well-nourished. No distress.  HENT:  Head: Normocephalic.  Eyes: Conjunctivae and EOM are normal.  Cardiovascular: Normal rate.   Pulmonary/Chest: Effort normal. No stridor.  Abdominal: Soft.  Gravid  Musculoskeletal: Normal range of motion.  Neurological: She is alert and oriented to person, place, and time.  Skin:  Small paronychia to left fourth digit ulnar side, no active discharge, no associated felon.  Psychiatric: She has a normal mood and affect.  Nursing note and vitals reviewed.   ED Course  INCISION AND DRAINAGE Date/Time: 02/01/2014 5:01 PM Performed by: Wynetta EmeryPISCIOTTA, Giovonnie Trettel Authorized by: Wynetta EmeryPISCIOTTA, Lynell Kussman Consent: Verbal consent obtained. Consent given by: patient Type: abscess Body area: upper extremity Location details: left ring finger Patient sedated: no Scalpel  size: 11 Incision type: single straight Complexity: simple Drainage: bloody Wound treatment: wound left open Patient tolerance: Patient tolerated the procedure well with no immediate complications   (including critical care time)  DIAGNOSTIC STUDIES: Oxygen Saturation is 100% on RA, normal by my interpretation.    COORDINATION OF CARE: 12:53 PM - Discussed treatment plan with pt at bedside which includes antibiotics and possible I&D and pt agreed to plan.   Pt was offered referral to OB/GYN. Patient declined.  Labs Review Labs  Reviewed - No data to display  Imaging Review No results found.   EKG Interpretation None      MDM   Final diagnoses:  Paronychia of finger, left    Filed Vitals:   02/01/14 1131 02/01/14 1323  BP: 132/82 110/65  Pulse: 67 64  Temp: 98.3 F (36.8 C) 97.8 F (36.6 C)  TempSrc: Oral Oral  Resp: 20 18  Height: 5\' 3"  (1.6 m)   Weight: 185 lb (83.915 kg)   SpO2: 100% 98%    Medications  cephALEXin (KEFLEX) capsule 500 mg (500 mg Oral Given 02/01/14 1312)  acetaminophen (TYLENOL) tablet 1,000 mg (1,000 mg Oral Given 02/01/14 1314)    Ellen Hall is a 28 y.o. female presenting with small perineal Kea to left ring finger, incision and drainage performed with bloody discharge. Patient will be started on Keflex. Patient states she is 4 months pregnant, has not followed up with OB/GYN and declines referral to OB/GYN. I've advised her she is welcome to call the ED or return for referral to [facility] at any time.  Evaluation does not show pathology that would require ongoing emergent intervention or inpatient treatment. Pt is hemodynamically stable and mentating appropriately. Discussed findings and plan with patient/guardian, who agrees with care plan. All questions answered. Return precautions discussed and outpatient follow up given.   Discharge Medication List as of 02/01/2014  1:17 PM    START taking these medications   Details  cephALEXin (KEFLEX) 500 MG capsule Take 1 capsule (500 mg total) by mouth 4 (four) times daily., Starting 02/01/2014, Until Discontinued, Print         I personally performed the services described in this documentation, which was scribed in my presence. The recorded information has been reviewed and is accurate.    Wynetta Emeryicole Alenna Russell, PA-C 02/01/14 1704  Tilden FossaElizabeth Rees, MD 02/01/14 [id number]

## 2014-02-01 NOTE — ED Notes (Signed)
Pt reports left ring finger swelling; discharge at nail bed. LMP August. Pos preg test. Denies any bleeding cramping.

## 2014-02-01 NOTE — Discharge Instructions (Signed)
Take acetaminophen (Tylenol) up to 975 mg (this is normally 3 over-the-counter pills) up to 3 times a day. Do not drink alcohol. Make sure your other medications do not contain acetaminophen (Read the labels!)  Take your antibiotics as directed and to completion. You should never have any leftover antibiotics! Push fluids and stay well hydrated.   Do not hesitate to return to the emergency room for any new, worsening or concerning symptoms.  Please obtain primary care using resource guide below. But the minute you were seen in the emergency room and that they will need to obtain records for further outpatient management.   Emergency Department Resource Guide 1) Find a Doctor and Pay Out of Pocket Although you won't have to find out who is covered by your insurance plan, it is a good idea to ask around and get recommendations. You will then need to call the office and see if the doctor you have chosen will accept you as a new patient and what types of options they offer for patients who are self-pay. Some doctors offer discounts or will set up payment plans for their patients who do not have insurance, but you will need to ask so you aren't surprised when you get to your appointment.  2) Contact Your Local Health Department Not all health departments have doctors that can see patients for sick visits, but many do, so it is worth a call to see if yours does. If you don't know where your local health department is, you can check in your phone book. The CDC also has a tool to help you locate your state's health department, and many state websites also have listings of all of their local health departments.  3) Find a Walk-in Clinic If your illness is not likely to be very severe or complicated, you may want to try a walk in clinic. These are popping up all over the country in pharmacies, drugstores, and shopping centers. They're usually staffed by nurse practitioners or physician assistants that have  been trained to treat common illnesses and complaints. They're usually fairly quick and inexpensive. However, if you have serious medical issues or chronic medical problems, these are probably not your best option.  No Primary Care Doctor: - Call Health Connect at  [phone] - they can help you locate a primary care doctor that  accepts your insurance, provides certain services, etc. - Physician Referral Service- [phone]  Chronic Pain Problems: Organization         Address  Phone   Notes  [facility]  [phone] Patients need to be referred by their primary care doctor.   Medication Assistance: Organization         Address  Phone   Notes  [facility] [address] [place], [place] [address] [phone] --Must be a resident of [facility] -- Must have NO insurance coverage whatsoever (no Medicaid/ Medicare, etc.) -- The pt. MUST have a primary care doctor that directs their care regularly and follows them in the community   [facility]  [phone]   [employer]  [phone]    Agencies that provide inexpensive medical care: Organization         Address  Phone   Notes  [facility]  [phone]   [facility]    [phone]   [facility] [address] [place], [place] [address] [phone]  [facility] [address] [place] [place] [phone]   [facility]anned Parenthood    [phone]   [facility]    [phone]   [facility]  [address], [place] Phone:  [phone], Fax:  [phone] Hours of Operation:  9 am - 6 pm, M-F.  Also accepts Medicaid/Medicare and self-pay.  [facility]  [address], [place] Phone: [phone], Fax: [phone]. Hours of Operation:  8:30 am - 5:30 pm, M-F.  Also accepts Medicaid and  self-pay.  [facility] [place] [address], [place] Phone: [phone]   [facility] [address] Natasha Bence [place], [place] [phone], Ext. [phone] Mondays & Thursdays: 7-9 AM.  First 15 patients are seen on a first come, first serve basis.    Medicaid-accepting [facility] Providers:  Organization         Address  Phone   Notes  [facility] [address], [place] [phone] Also accepts self-pay patients.  [facility] [address] Laurell Josephs [place], [place]  [phone]   [facility] [address], [place] [phone]   [facility] [address] [place] [phone]   Renaye Rakers [address], [place]   [phone] Only accepts [place] patients after they have their name applied to their card.   Self-Pay (no insurance) in [facility]:  Organization         Address  Phone   Notes  Sickle Cell Patients, [facility] [address] [place], [place] [phone]   [facility] [address] [place], [place] [phone]   [facility]  [address],  [phone]   [facility]/Dr. Osei-Bonsu  [address] [place] or [address], [place] [phone] Phone number for both [place] and [place] locations is the same.  [facility] [address] [place] [phone]   [facility] [address], [place] or [address] [phone]   [facility] [address] [place] [phone], phone; [phone], fax Sees patients 1st and 3rd Saturday of every month.  Must not qualify for public or private insurance (i.e. Medicaid, Medicare, [place] Health Choice, Veterans' Benefits)  Household income  should be no more than 200% of the poverty level The clinic cannot treat you if you are pregnant or think you are pregnant  Sexually transmitted diseases are not treated at the clinic.    Dental Care: Organization         Address  Phone  Notes  [facility] [address] [place], [place] [phone] Accepts children up to age 74 who are enrolled in [place] or [place] Health Choice; pregnant women with a Medicaid card; and children who have applied for Medicaid or [place] Health Choice, but were declined, whose parents can pay a reduced fee at time of service.  [facility]  [address], [place] [phone] Accepts children up to age 54 who are enrolled in [place] or [place] Health Choice; pregnant women with a Medicaid card; and  Medicaid card; and children who have applied for Medicaid or [place] Health Choice, but were declined, whose parents can pay a reduced fee at time of service.  °[facility] ° [address], [place] [phone] Patients are seen by appointment only. Walk-ins are not accepted. [facility] will see patients 18 years of age and older. °Monday - Tuesday (8am-5pm) °Most Wednesdays (8:30-5pm) °$30 per visit, cash only  °[facility] ° [address], [place] [phone] Patients are seen by appointment only. Walk-ins are not accepted. [facility] will see patients 18 years of age and older. °One Wednesday Evening (Monthly: Volunteer Based).  $30 per visit, cash only  °[facility]  [phone] for adults; Children under age 4, call Graduate Pediatric Dentistry at [phone]. Children aged 4-14, please call [phone] to request a pediatric application. ° Dental services are provided in all areas of dental care including fillings, crowns and bridges, complete and partial dentures, implants, gum  treatment, root canals, and extractions. Preventive care is also provided. Treatment is provided to both adults and children. °Patients are selected via a lottery and there is often a waiting list. °  °[facility] [address], °[place] ° ([phone] [email or web address] °  °[facility] [address], [place], [place] [phone], Ext. [phone] Second and Fourth Thursday of each month, opens at 6:30 AM; Clinic ends at 9 AM.  Patients are seen on a first-come first-served basis, and a limited number are seen during each clinic.  ° °[facility] ° [address], [place], [place] [phone]   Eligibility Requirements °You must have lived in [place], [place], or [place] counties for at least the last three months. °  You cannot be eligible for state or federal sponsored healthcare insurance, including Veterans Administration, Medicaid, or Medicare. °  You generally cannot be eligible for healthcare insurance through your employer.  °  How to apply: °Eligibility screenings are held every Tuesday and Wednesday afternoon from 1:00 pm until 4:00 pm. You do not need an appointment for the interview!  °[facility] [address], [place], [place] [phone]   °[facility]  [phone]   °[facility]  [phone]   °[facility]  336-570-6415   ° °Behavioral Health Resources in the Community: °Intensive Outpatient Programs °Organization         Address  Phone  Notes  °[facility] [address], [place], [place] [phone]   °[facility] [address], [place], [place] [phone]   °[facility] [address], [place], [place] ° 336-882-2125   °[facility] [address],  °[place], [place] 1-800-853-5163 or 336-641-4981   °Substance Abuse Resources °Organization         Address  Phone  Notes  °[facility]  [facility]  336-882-2125   °[facility]  336-784-9470   °[facility]  336-285-9073   °[facility]  336-845-3988   °Residential & Outpatient Substance Abuse Program  1-800-659-3381   °Psychological Services °Organization         Address  Phone  Notes  °[facility]  336- 832-9600   °[facility]  336- 378-7881   °[facility] [address], [place] 1-800-853-5163 or 336-641-4981   ° °Mobile Crisis Teams °Organization           Address  Phone  Notes  [facility], Mobile Crisis Care Unit  [phone]   [facility]  [address] [place], [place] [phone]   [facility] [address] [place] [phone]    Self-Help/Support Groups Organization         Address  Phone             Notes  [facility] - variety of support groups  336- I7437963916-230-0063 Call for more information  Narcotics Anonymous (NA), [facility] [address], [place]  2 meetings at this location   [facility] Treatment Programs Organization         Address  Phone  Notes  [facility] [address],    [place]  [phone]   [facility]  [address], [place] [phone], [place], [place] [phone]   [facility] [address], [place] [phone] Admissions: 8am-3pm M-F  [facility] [address]    [place], [place] [phone]   [facility] [address], [place], [place] [phone]   [facility] [address]  [place], [place] [phone]   [facility] - Intensive Outpatient [address], [place], [place] [phone]   [facility] ([facility] [address]  [place], [place] [phone] or [phone]   [facility] ([facility]) [address] [place], [place] [phone] Accepts Medicaid  [facility] [address]  [place] [phone] Substance Abuse/Addiction Treatment   [facility] Resources Organization         Address  Phone  Notes  [facility]  [phone](888) [phone]   Angie FavaJulie Brannon, PhD [address] [place], [place]   [phone]) [phone] or [phone](336) [phone]   [facility]   [address] [place], [place] [phone](336) [phone]   [facility] [address], [place], [place] [phone](336) [phone] Insurance/Medicaid/sponsorship through [facility]  [facility] [address]                                    [place], [place] [phone](336) [phone] Therapy/tele-psych/case  [facility] [address]   [place], [place] [phone](336) [phone]    Dr. Lolly MustacheArfeen  [phone]) [phone]   [facility]  [facility] 1) [address], [place] 2) [address], [place] 3)  [address], [place] [phone](336) [phone]  [phone]([phone]   [facility] Child Abuse Hotline 8138096550(336) [phone] or [phone](336) [phone] (After Hours)        Paronychia Paronychia is an inflammatory reaction involving the folds of the skin surrounding the fingernail. This is commonly caused by an infection in the skin around a nail. The most common cause of paronychia is frequent wetting of the hands (as seen with bartenders, food servers, nurses or others who wet their hands). This makes the skin around the fingernail susceptible to infection by bacteria (germs) or fungus. Other predisposing factors are:  Aggressive manicuring.  Nail biting.  Thumb sucking. The most common cause is a staphylococcal (a type of germ) infection, or a fungal (Candida) infection. When caused by a germ, it usually comes on suddenly with redness, swelling, pus and is often painful. It may get under the nail and form an  abscess (collection of pus), or form an abscess around the nail. If the nail itself is infected with a fungus, the treatment is usually prolonged and  may require oral medicine for up to one year. Your caregiver will determine the length of time treatment is required. The paronychia caused by bacteria (germs) may largely be avoided by not pulling on hangnails or picking at cuticles. When the infection occurs at the tips of the finger it is called felon. When the cause of paronychia is from the herpes simplex virus (HSV) it is called herpetic whitlow. TREATMENT  When an abscess is present treatment is often incision and drainage. This means that the abscess must be cut open so the pus can get out. When this is done, the following home care instructions should be followed. HOME CARE INSTRUCTIONS   It is important to keep the affected fingers very dry. Rubber or plastic gloves over cotton gloves should be used whenever the hand must be placed in water.  Keep wound clean, dry and dressed as suggested by your caregiver between warm soaks or warm compresses.  Soak in warm water for fifteen to twenty minutes three to four times per day for bacterial infections. Fungal infections are very difficult to treat, so often require treatment for long periods of time.  For bacterial (germ) infections take antibiotics (medicine which kill germs) as directed and finish the prescription, even if the problem appears to be solved before the medicine is gone.  Only take over-the-counter or prescription medicines for pain, discomfort, or fever as directed by your caregiver. SEEK IMMEDIATE MEDICAL CARE IF:  You have redness, swelling, or increasing pain in the wound.  You notice pus coming from the wound.  You have a fever.  You notice a bad smell coming from the wound or dressing. Document Released: 08/26/2000 Document Revised: 05/25/2011 Document Reviewed: 04/27/2008 [facility] Patient Information 2015 [place], [place]. This information is not intended to replace advice given to you by your health care provider. Make sure you discuss any questions you have with  your health care provider.

## 2014-03-16 NOTE — L&D Delivery Note (Cosign Needed)
.  Delivery Note At 2:05 AM a viable and healthy female was delivered via Vaginal, Spontaneous Delivery (Presentation: ; Occiput Anterior).  APGAR: 9, 9; weight  .   Placenta status: Intact, Spontaneous.  Cord: 3 vessels with the following complications: None.    Anesthesia: Epidural  Episiotomy: None Lacerations: None Suture Repair: n/a Est. Blood Loss (mL): 400  Healthy female infant delivered over intact perineum. Immediate cry, placed on maternal abdomen. Placenta delivered quickly without complications. Moderate bleeding following this and slightly boggy fundus which firmed up with fundal massage and bleeding stopped.  Mom to postpartum.  Baby to Couplet care / Skin to Skin. Placenta to pathology.  Beverely Lowdamo, Elena 06/16/2014, 2:26 AM   Patient is a [id number] at 6136w1d who was admitted w/ SOL and no prenatal care. She progressed without augmentation.  I was gloved and present for delivery in its entirety.  Second stage of labor progressed, baby delivered after a few contractions.    Complications: none    Jhett Fretwell, CNM 8:52 AM

## 2014-03-18 ENCOUNTER — Encounter (HOSPITAL_COMMUNITY): Payer: Self-pay | Admitting: Emergency Medicine

## 2014-03-18 ENCOUNTER — Emergency Department (HOSPITAL_COMMUNITY)
Admission: EM | Admit: 2014-03-18 | Discharge: 2014-03-18 | Disposition: A | Discharge status: Home / Self Care | Payer: Medicaid Other | Attending: Emergency Medicine | Admitting: Emergency Medicine

## 2014-03-18 DIAGNOSIS — Z79899 Other long term (current) drug therapy: Secondary | ICD-10-CM | POA: Diagnosis not present

## 2014-03-18 DIAGNOSIS — Z8619 Personal history of other infectious and parasitic diseases: Secondary | ICD-10-CM | POA: Insufficient documentation

## 2014-03-18 DIAGNOSIS — Z87891 Personal history of nicotine dependence: Secondary | ICD-10-CM | POA: Insufficient documentation

## 2014-03-18 DIAGNOSIS — L03011 Cellulitis of right finger: Secondary | ICD-10-CM | POA: Insufficient documentation

## 2014-03-18 DIAGNOSIS — Z793 Long term (current) use of hormonal contraceptives: Secondary | ICD-10-CM | POA: Insufficient documentation

## 2014-03-18 DIAGNOSIS — M79641 Pain in right hand: Secondary | ICD-10-CM | POA: Diagnosis present

## 2014-03-18 MED ORDER — CEPHALEXIN 500 MG PO CAPS: 500.0000 mg | ORAL_CAPSULE | Freq: Four times a day (QID) | ORAL | Status: DC

## 2014-03-18 NOTE — ED Notes (Signed)
C/o infection to R middle finger x 4 days.  Reports pain and swelling to same.

## 2014-03-18 NOTE — ED Provider Notes (Signed)
CSN: [id number]     Arrival date & time 03/18/14  2001 History   This chart was scribed for non-physician practitioner, Teressa Lower, NP, working with Enid Skeens, MD, by Lionel December, ED Scribe. This patient was seen in room TR07C/TR07C and the patient's care was started at 8:16 PM.   Chief Complaint  Patient presents with  . Hand Pain     (Consider location/radiation/quality/duration/timing/severity/associated sxs/prior Treatment) The history is provided by the patient. No language interpreter was used.    HPI Comments: Ellen Hall is a 29 y.o. female who presents to the Emergency Department complaining of an infection in her right middle finger which started 4 days ago. She states that she has had the infection before and that she has had her finger drained previously.     Past Medical History  Diagnosis Date  . BV (bacterial vaginosis)   . Late prenatal care    Past Surgical History  Procedure Laterality Date  . No past surgeries     Family History  Problem Relation Age of Onset  . Diabetes Mother   . Diabetes Sister   . Diabetes Maternal Grandmother    History  Substance Use Topics  . Smoking status: Former Games developer  . Smokeless tobacco: Never Used  . Alcohol Use: No   OB History    Gravida Para Term Preterm AB TAB SAB Ectopic Multiple Living   Review of Systems  Constitutional: Negative for fever and fatigue.  Skin: Positive for wound.  All other systems reviewed and are negative.     Allergies  Review of patient's allergies indicates no known allergies.  Home Medications   Prior to Admission medications   Medication Sig Start Date End Date Taking? Authorizing Provider  cephALEXin (KEFLEX) 500 MG capsule Take 1 capsule (500 mg total) by mouth 4 (four) times daily. 02/01/14   Nicole Pisciotta, PA-C  medroxyPROGESTERone (DEPO-PROVERA) 150 MG/ML injection Inject 1 mL (150 mg total) into the muscle every 3 (three) months. At  clinic 10/29/12   Marge Duncans, CNM  prenatal vitamin w/FE, FA (PRENATAL 1 + 1) 27-1 MG TABS Take 1 tablet by mouth daily. 08/16/12   Deirdre C Poe, CNM   BP 106/60 mmHg  Pulse 91  Temp(Src) 98.5 F (36.9 C) (Oral)  Resp 18  Ht  (1.6 m)  Wt 185 lb (83.915 kg)  BMI 32.78 kg/m2  SpO2 97% Physical Exam  Cardiovascular: Normal rate and regular rhythm.   Pulmonary/Chest: Effort normal and breath sounds normal.  Abdominal:  gravid  Musculoskeletal: Normal range of motion.  Neurological: She is alert.  Skin:  Pt has redness and green discoloration around the right middle nail  Nursing note and vitals reviewed.   ED Course  Drain paronychia Date/Time: 03/18/2014 8:28 PM Performed by: Teressa Lower Authorized by: Teressa Lower Consent: Verbal consent obtained. Consent given by: patient Patient identity confirmed: verbally with patient Time out: Immediately prior to procedure a "time out" was called to verify the correct patient, procedure, equipment, support staff and site/side marked as required. Local anesthesia used: no Patient tolerance: Patient tolerated the procedure well with no immediate complications Comments: Wound opened with 18 gauge need with green drainage noted   (including critical care time) DIAGNOSTIC STUDIES: Oxygen Saturation is 97% on RA, normal by my interpretation.    COORDINATION OF CARE: 8:19 PM Discussed treatment plan with patient at beside, the  patient agrees with the plan and has no further questions at this time.  Labs Review Labs Reviewed - No data to display  Imaging Review No results found.   EKG Interpretation None      MDM   Final diagnoses:  Paronychia, right    No sign of felon. Will treat with keflex. Area drained.  I personally performed the services described in this documentation, which was scribed in my presence. The recorded information has been reviewed and is accurate.  Teressa Lower,  NP 03/18/14 2029  Enid Skeens, MD 03/18/14 [phone]

## 2014-03-18 NOTE — Discharge Instructions (Signed)
Paronychia  Paronychia is an infection of the skin caused by germs. It happens by the fingernail or toenail. You can avoid it by not:  Pulling on hangnails.  Nail biting.  Thumb sucking.  Cutting fingernails and toenails too short.  Cutting the skin at the base and sides of the fingernail or toenail (cuticle). HOME CARE  Keep the fingers or toes very dry. Put rubber gloves over cotton gloves when putting hands in water.  Keep the wound clean and bandaged (dressed) as told by your doctor.  Soak the fingers or toes in warm water for 15 to 20 minutes. Soak them 3 to 4 times per day for germ infections. Fungal infections are difficult to treat. Fungal infections often require treatment for a long time.  Only take medicine as told by your doctor. GET HELP RIGHT AWAY IF:   You have redness, puffiness (swelling), or pain that gets worse.  You see yellowish-white fluid (pus) coming from the wound.  You have a fever.  You have a bad smell coming from the wound or bandage. MAKE SURE YOU:  Understand these instructions.  Will watch your condition.  Will get help if you are not doing well or get worse. Document Released: 02/18/2009 Document Revised: 05/25/2011 Document Reviewed: 02/18/2009 ExitCare Patient Information 2015 [employer]. This information is not intended to replace advice given to you by your health care provider. Make sure you discuss any questions you have with your health care provider.  

## 2014-06-15 ENCOUNTER — Inpatient Hospital Stay (HOSPITAL_COMMUNITY): Payer: Medicaid Other | Admitting: Anesthesiology

## 2014-06-15 ENCOUNTER — Encounter (HOSPITAL_COMMUNITY): Payer: Self-pay | Admitting: *Deleted

## 2014-06-15 ENCOUNTER — Inpatient Hospital Stay (HOSPITAL_COMMUNITY)
Admission: AD | Admit: 2014-06-15 | Discharge: 2014-06-18 | DRG: 775 | Disposition: A | Discharge status: Home / Self Care | Payer: Medicaid Other | Source: Ambulatory Visit | Attending: Obstetrics & Gynecology | Admitting: Obstetrics & Gynecology

## 2014-06-15 ENCOUNTER — Inpatient Hospital Stay (HOSPITAL_COMMUNITY): Payer: Medicaid Other

## 2014-06-15 DIAGNOSIS — Z3689 Encounter for other specified antenatal screening: Secondary | ICD-10-CM | POA: Insufficient documentation

## 2014-06-15 DIAGNOSIS — F1290 Cannabis use, unspecified, uncomplicated: Secondary | ICD-10-CM | POA: Diagnosis present

## 2014-06-15 DIAGNOSIS — O99324 Drug use complicating childbirth: Secondary | ICD-10-CM | POA: Diagnosis present

## 2014-06-15 DIAGNOSIS — Z87891 Personal history of nicotine dependence: Secondary | ICD-10-CM

## 2014-06-15 DIAGNOSIS — Z833 Family history of diabetes mellitus: Secondary | ICD-10-CM | POA: Diagnosis not present

## 2014-06-15 DIAGNOSIS — O0933 Supervision of pregnancy with insufficient antenatal care, third trimester: Secondary | ICD-10-CM | POA: Insufficient documentation

## 2014-06-15 DIAGNOSIS — Z3A36 36 weeks gestation of pregnancy: Secondary | ICD-10-CM | POA: Insufficient documentation

## 2014-06-15 DIAGNOSIS — O0930 Supervision of pregnancy with insufficient antenatal care, unspecified trimester: Secondary | ICD-10-CM

## 2014-06-15 DIAGNOSIS — F1490 Cocaine use, unspecified, uncomplicated: Secondary | ICD-10-CM | POA: Diagnosis present

## 2014-06-15 DIAGNOSIS — IMO0001 Reserved for inherently not codable concepts without codable children: Secondary | ICD-10-CM

## 2014-06-15 LAB — GROUP B STREP BY PCR: Group B strep by PCR: NEGATIVE

## 2014-06-15 LAB — CBC
HEMATOCRIT: 34.4 % — AB (ref 36.0–46.0)
Hemoglobin: 11.6 g/dL — ABNORMAL LOW (ref 12.0–15.0)
MCH: 34 pg (ref 26.0–34.0)
MCHC: 33.7 g/dL (ref 30.0–36.0)
MCV: 100.9 fL — AB (ref 78.0–100.0)
PLATELETS: 137 10*3/uL — AB (ref 150–400)
RBC: 3.41 MIL/uL — AB (ref 3.87–5.11)
RDW: 13.6 % (ref 11.5–15.5)
WBC: 6.5 10*3/uL (ref 4.0–10.5)

## 2014-06-15 LAB — RAPID URINE DRUG SCREEN, HOSP PERFORMED
Amphetamines: NOT DETECTED
BARBITURATES: NOT DETECTED
Benzodiazepines: NOT DETECTED
Cocaine: POSITIVE — AB
OPIATES: NOT DETECTED
TETRAHYDROCANNABINOL: POSITIVE — AB

## 2014-06-15 LAB — TYPE AND SCREEN
ABO/RH(D): O POS
Antibody Screen: NEGATIVE

## 2014-06-15 LAB — RAPID HIV SCREEN (HIV 1/2 AB+AG)
HIV 1/2 ANTIBODIES: NONREACTIVE
HIV-1 P24 Antigen - HIV24: NONREACTIVE

## 2014-06-15 LAB — OB RESULTS CONSOLE GBS: STREP GROUP B AG: NEGATIVE

## 2014-06-15 MED ORDER — LACTATED RINGERS IV SOLN: 500.0000 mL | INTRAVENOUS | Status: DC | PRN

## 2014-06-15 MED ORDER — LACTATED RINGERS IV SOLN: 500.0000 mL | Freq: Once | INTRAVENOUS | Status: DC

## 2014-06-15 MED ORDER — EPHEDRINE 5 MG/ML INJ
10.0000 mg | INTRAVENOUS | Status: DC | PRN
  Filled 2014-06-15: qty 2

## 2014-06-15 MED ORDER — PHENYLEPHRINE 40 MCG/ML (10ML) SYRINGE FOR IV PUSH (FOR BLOOD PRESSURE SUPPORT)
80.0000 ug | PREFILLED_SYRINGE | INTRAVENOUS | Status: DC | PRN
  Filled 2014-06-15: qty 20
  Filled 2014-06-15: qty 2

## 2014-06-15 MED ORDER — OXYCODONE-ACETAMINOPHEN 5-325 MG PO TABS: 2.0000 | ORAL_TABLET | ORAL | Status: DC | PRN

## 2014-06-15 MED ORDER — LACTATED RINGERS IV SOLN
INTRAVENOUS | Status: DC
Start: 2014-06-15 — End: 2014-06-16
  Administered 2014-06-15 – 2014-06-16 (×2): via INTRAVENOUS

## 2014-06-15 MED ORDER — PHENYLEPHRINE 40 MCG/ML (10ML) SYRINGE FOR IV PUSH (FOR BLOOD PRESSURE SUPPORT)
80.0000 ug | PREFILLED_SYRINGE | INTRAVENOUS | Status: DC | PRN
  Filled 2014-06-15: qty 2

## 2014-06-15 MED ORDER — FLEET ENEMA 7-19 GM/118ML RE ENEM: 1.0000 | ENEMA | RECTAL | Status: DC | PRN

## 2014-06-15 MED ORDER — LIDOCAINE HCL (PF) 1 % IJ SOLN
30.0000 mL | INTRAMUSCULAR | Status: DC | PRN
  Filled 2014-06-15: qty 30

## 2014-06-15 MED ORDER — OXYCODONE-ACETAMINOPHEN 5-325 MG PO TABS
1.0000 | ORAL_TABLET | ORAL | Status: DC | PRN
Start: 2014-06-15 — End: 2014-06-16

## 2014-06-15 MED ORDER — OXYTOCIN 40 UNITS IN LACTATED RINGERS INFUSION - SIMPLE MED
62.5000 mL/h | INTRAVENOUS | Status: DC
Start: 2014-06-15 — End: 2014-06-16
  Filled 2014-06-15: qty 1000

## 2014-06-15 MED ORDER — ACETAMINOPHEN 325 MG PO TABS: 650.0000 mg | ORAL_TABLET | ORAL | Status: DC | PRN

## 2014-06-15 MED ORDER — FENTANYL 2.5 MCG/ML BUPIVACAINE 1/10 % EPIDURAL INFUSION (WH - ANES)
INTRAMUSCULAR | Status: DC | PRN
  Administered 2014-06-15: 14 mL/h via EPIDURAL

## 2014-06-15 MED ORDER — OXYTOCIN BOLUS FROM INFUSION
500.0000 mL | INTRAVENOUS | Status: DC
  Administered 2014-06-16: 500 mL via INTRAVENOUS

## 2014-06-15 MED ORDER — LIDOCAINE HCL (PF) 1 % IJ SOLN
INTRAMUSCULAR | Status: DC | PRN
  Administered 2014-06-15: 3 mL
  Administered 2014-06-15 (×2): 5 mL

## 2014-06-15 MED ORDER — DIPHENHYDRAMINE HCL 50 MG/ML IJ SOLN: 12.5000 mg | INTRAMUSCULAR | Status: DC | PRN

## 2014-06-15 MED ORDER — ONDANSETRON HCL 4 MG/2ML IJ SOLN: 4.0000 mg | Freq: Four times a day (QID) | INTRAMUSCULAR | Status: DC | PRN

## 2014-06-15 MED ORDER — CITRIC ACID-SODIUM CITRATE 334-500 MG/5ML PO SOLN: 30.0000 mL | ORAL | Status: DC | PRN

## 2014-06-15 MED ORDER — SODIUM CHLORIDE 0.9 % IV SOLN
2.0000 g | Freq: Once | INTRAVENOUS | Status: AC
  Administered 2014-06-15: 2 g via INTRAVENOUS
  Filled 2014-06-15: qty 2000

## 2014-06-15 MED ORDER — FENTANYL 2.5 MCG/ML BUPIVACAINE 1/10 % EPIDURAL INFUSION (WH - ANES)
14.0000 mL/h | INTRAMUSCULAR | Status: DC | PRN
  Administered 2014-06-15: 14 mL/h via EPIDURAL
  Filled 2014-06-15: qty 125

## 2014-06-15 NOTE — MAU Note (Signed)
Patient states she was seen at the clinic this morning when her mucous plug came out and she started having more intense contractions and light leakage of fluids after leaving the office around 10am.

## 2014-06-15 NOTE — Anesthesia Procedure Notes (Signed)
Epidural Patient location during procedure: OB  Staffing Anesthesiologist: Cassanda Walmer Performed by: anesthesiologist   Preanesthetic Checklist Completed: patient identified, site marked, surgical consent, pre-op evaluation, timeout performed, IV checked, risks and benefits discussed and monitors and equipment checked  Epidural Patient position: sitting Prep: ChloraPrep Patient monitoring: heart rate, continuous pulse ox and blood pressure Approach: right paramedian Location: L3-L4 Injection technique: LOR saline  Needle:  Needle type: Tuohy  Needle gauge: 17 G Needle length: 9 cm and 9 Needle insertion depth: 6 cm Catheter type: closed end flexible Catheter size: 20 Guage Catheter at skin depth: 10 cm Test dose: negative  Assessment Events: blood not aspirated, injection not painful, no injection resistance, negative IV test and no paresthesia  Additional Notes   Patient tolerated the insertion well without complications.   

## 2014-06-15 NOTE — Anesthesia Preprocedure Evaluation (Signed)
Anesthesia Evaluation  Patient identified by MRN, date of birth, ID band Patient awake    Reviewed: Allergy & Precautions, H&P , NPO status , Patient's Chart, lab work & pertinent test results  History of Anesthesia Complications Negative for: history of anesthetic complications  Airway Mallampati: II TM Distance: >3 FB Neck ROM: full    Dental no notable dental hx. (+) Teeth Intact   Pulmonary neg pulmonary ROS, former smoker,  breath sounds clear to auscultation  Pulmonary exam normal       Cardiovascular negative cardio ROS  Rhythm:regular Rate:Normal     Neuro/Psych negative neurological ROS  negative psych ROS   GI/Hepatic negative GI ROS, Neg liver ROS,   Endo/Other  negative endocrine ROS  Renal/GU negative Renal ROS  negative genitourinary   Musculoskeletal   Abdominal Normal abdominal exam  (+)   Peds  Hematology negative hematology ROS (+)   Anesthesia Other Findings   Reproductive/Obstetrics (+) Pregnancy                           Anesthesia Physical Anesthesia Plan  ASA: II  Anesthesia Plan: Epidural   Post-op Pain Management:    Induction:   Airway Management Planned:   Additional Equipment:   Intra-op Plan:   Post-operative Plan:   Informed Consent: I have reviewed the patients History and Physical, chart, labs and discussed the procedure including the risks, benefits and alternatives for the proposed anesthesia with the patient or authorized representative who has indicated his/her understanding and acceptance.     Plan Discussed with:   Anesthesia Plan Comments:         Anesthesia Quick Evaluation  

## 2014-06-15 NOTE — Progress Notes (Signed)
Monitors applied after US.

## 2014-06-15 NOTE — Progress Notes (Signed)
This note also relates to the following rows which could not be included: BP - Cannot attach notes to unvalidated device data Pulse Rate - Cannot attach notes to unvalidated device data   Monitors reapplied after epidural.

## 2014-06-15 NOTE — H&P (Signed)
Ellen Hall is a 29 y.o. female presenting for leaking fluid and contractions. Patient denies having any prenatal care with this pregnancy but LMP of 7/24 puts her at 2270w0d. This morning she lost her mucus plug and went to an unknown doctor where she did not get any treatment per her. After she left that clinic she started having contractions every ~10 minutes and slowly leaking fluid. This continued and contractions were getting stronger so she presented to the MAU. Denies bleeding, LOF. Normal fetal movement. Reports 4 prior term vaginal deliveries, all healthy. No medical history. Reports using marijuana in pregnancy but denies other substance use. No medical history, htn, dm, per patient.  Maternal Medical History:  Reason for admission: Rupture of membranes.   Contractions: Onset was 3-5 hours ago.   Frequency: irregular.   Perceived severity is moderate.    Fetal activity: Perceived fetal activity is normal.   Last perceived fetal movement was within the past hour.    Prenatal complications: Substance abuse.   No bleeding.   Prenatal Complications - Diabetes: none.    OB History    Gravida Para Term Preterm AB TAB SAB Ectopic Multiple Living   5 4 4       4      Past Medical History  Diagnosis Date  . BV (bacterial vaginosis)   . Late prenatal care    Past Surgical History  Procedure Laterality Date  . No past surgeries     Family History: family history includes Diabetes in her maternal grandmother, mother, and sister. Social History:  reports that she has quit smoking. She has never used smokeless tobacco. She reports that she does not drink alcohol or use illicit drugs.   Prenatal Transfer Tool  Maternal Diabetes: No. No PNC Genetic Screening: Declined Maternal Ultrasounds/Referrals: Declined Fetal Ultrasounds or other Referrals:  None Maternal Substance Abuse:  Yes:  Type: Marijuana Significant Maternal Medications:  None Significant Maternal Lab Results:   None Other Comments:  No prenatal care. Unclear dating.   ROS See HPI  Dilation: 5.5 Effacement (%): 100 Exam by:: Ysidro EvertFran Creshenzo-Dishmon, CNM Blood pressure 116/68, pulse 95, temperature 98.2 F (36.8 C), temperature source Oral, resp. rate 18, height 5\' 4"  (1.626 m), weight 83.915 kg (185 lb), last menstrual period 10/06/2013, unknown if currently breastfeeding.   Maternal Exam:  Uterine Assessment: Contraction strength is moderate.  Contraction frequency is regular.   Abdomen: Patient reports no abdominal tenderness.   Fetal Exam Fetal Monitor Review: Mode: ultrasound.   Baseline rate: 130.  Variability: moderate (6-25 bpm).   Pattern: accelerations present and no decelerations.    Fetal State Assessment: Category I - tracings are normal.     Physical Exam  Nursing note and vitals reviewed. Constitutional: She is oriented to person, place, and time. She appears well-developed and well-nourished. No distress.  HENT:  Head: Normocephalic and atraumatic.  Eyes: Conjunctivae are normal.  Cardiovascular: Normal rate.   Respiratory: Effort normal. No respiratory distress.  GI: Soft. There is no tenderness.  Genitourinary: Vagina normal and uterus normal.  Neurological: She is alert and oriented to person, place, and time.  Skin: Skin is warm and dry. No rash noted. She is not diaphoretic.  Psychiatric: She has a normal mood and affect. Her behavior is normal.    Prenatal labs: pending ABO, Rh:  O+ Antibody:   Rubella:   RPR:    HBsAg:    HIV:    GBS:     Assessment/Plan: Labor Plan #  Labor: Expectant management. If not progressing will augment for SROM. Bedside ultrasound for dating/EFW to be done if time allows #Pain: epidural on request #FWB: category 1 #ID: GBS pending, ampicillin given  Beverely Low 06/15/2014, 11:00 PM   CNM attestation:  I have seen and examined this patient; I agree with above documentation in the resident's note.   Ellen Hall is  a 28 y.o. [id number] here for SOL without PNC.  PE: BP 133/75 mmHg  Pulse 76  Temp(Src) 97.9 F (36.6 C) (Oral)  Resp 18  Ht  (1.626 m)  Wt 83.915 kg (185 lb)  BMI 31.74 kg/m2  LMP 10/06/2013 (LMP Unknown) Gen: calm comfortable, NAD Resp: normal effort, no distress Abd: gravid  ROS, labs, PMH reviewed  GBS PCR= neg UDS= + cocaine and THC  Plan: Admit to [employer] Bedside U/S for dating if possible Will d/c further Amp Plan SW on [place], [place] CNM 06/15/2014, 11:30 PM

## 2014-06-15 NOTE — MAU Note (Signed)
Patient taken to room 167 on birthing suites via wheelchair.  Bedside KoreaS to be done in birthing suites.  Report/handoff to L&D RN.

## 2014-06-15 NOTE — Progress Notes (Signed)
Monitors off for epidural placement. 

## 2014-06-16 ENCOUNTER — Encounter (HOSPITAL_COMMUNITY): Payer: Self-pay | Admitting: *Deleted

## 2014-06-16 DIAGNOSIS — Z3689 Encounter for other specified antenatal screening: Secondary | ICD-10-CM | POA: Insufficient documentation

## 2014-06-16 DIAGNOSIS — O0933 Supervision of pregnancy with insufficient antenatal care, third trimester: Secondary | ICD-10-CM | POA: Insufficient documentation

## 2014-06-16 DIAGNOSIS — F1290 Cannabis use, unspecified, uncomplicated: Secondary | ICD-10-CM

## 2014-06-16 DIAGNOSIS — O99324 Drug use complicating childbirth: Secondary | ICD-10-CM

## 2014-06-16 DIAGNOSIS — Z3A36 36 weeks gestation of pregnancy: Secondary | ICD-10-CM | POA: Insufficient documentation

## 2014-06-16 DIAGNOSIS — O0930 Supervision of pregnancy with insufficient antenatal care, unspecified trimester: Secondary | ICD-10-CM

## 2014-06-16 LAB — HEPATITIS B SURFACE ANTIGEN: Hepatitis B Surface Ag: NEGATIVE

## 2014-06-16 LAB — RPR: RPR: NONREACTIVE

## 2014-06-16 LAB — HIV ANTIBODY (ROUTINE TESTING W REFLEX): HIV SCREEN 4TH GENERATION: NONREACTIVE

## 2014-06-16 LAB — OB RESULTS CONSOLE HIV ANTIBODY (ROUTINE TESTING): HIV: NONREACTIVE

## 2014-06-16 MED ORDER — SIMETHICONE 80 MG PO CHEW: 80.0000 mg | CHEWABLE_TABLET | ORAL | Status: DC | PRN

## 2014-06-16 MED ORDER — INFLUENZA VAC SPLIT QUAD 0.5 ML IM SUSY: 0.5000 mL | PREFILLED_SYRINGE | INTRAMUSCULAR | Status: DC

## 2014-06-16 MED ORDER — OXYCODONE-ACETAMINOPHEN 5-325 MG PO TABS
1.0000 | ORAL_TABLET | ORAL | Status: DC | PRN
  Administered 2014-06-16 – 2014-06-18 (×5): 1 via ORAL
  Filled 2014-06-16 (×5): qty 1

## 2014-06-16 MED ORDER — SENNOSIDES-DOCUSATE SODIUM 8.6-50 MG PO TABS
2.0000 | ORAL_TABLET | ORAL | Status: DC
  Administered 2014-06-17 – 2014-06-18 (×2): 2 via ORAL
  Filled 2014-06-16 (×2): qty 2

## 2014-06-16 MED ORDER — LANOLIN HYDROUS EX OINT: TOPICAL_OINTMENT | CUTANEOUS | Status: DC | PRN

## 2014-06-16 MED ORDER — DIBUCAINE 1 % RE OINT: 1.0000 "application " | TOPICAL_OINTMENT | RECTAL | Status: DC | PRN

## 2014-06-16 MED ORDER — PNEUMOCOCCAL VAC POLYVALENT 25 MCG/0.5ML IJ INJ
0.5000 mL | INJECTION | INTRAMUSCULAR | Status: AC
  Administered 2014-06-17: 0.5 mL via INTRAMUSCULAR
  Filled 2014-06-16: qty 0.5

## 2014-06-16 MED ORDER — ZOLPIDEM TARTRATE 5 MG PO TABS: 5.0000 mg | ORAL_TABLET | Freq: Every evening | ORAL | Status: DC | PRN

## 2014-06-16 MED ORDER — IBUPROFEN 600 MG PO TABS
600.0000 mg | ORAL_TABLET | Freq: Four times a day (QID) | ORAL | Status: DC
  Administered 2014-06-16 – 2014-06-18 (×10): 600 mg via ORAL
  Filled 2014-06-16 (×9): qty 1

## 2014-06-16 MED ORDER — WITCH HAZEL-GLYCERIN EX PADS: 1.0000 "application " | MEDICATED_PAD | CUTANEOUS | Status: DC | PRN

## 2014-06-16 MED ORDER — BENZOCAINE-MENTHOL 20-0.5 % EX AERO: 1.0000 "application " | INHALATION_SPRAY | CUTANEOUS | Status: DC | PRN

## 2014-06-16 MED ORDER — TETANUS-DIPHTH-ACELL PERTUSSIS 5-2.5-18.5 LF-MCG/0.5 IM SUSP
0.5000 mL | Freq: Once | INTRAMUSCULAR | Status: AC
  Administered 2014-06-16: 0.5 mL via INTRAMUSCULAR

## 2014-06-16 MED ORDER — ACETAMINOPHEN 325 MG PO TABS: 650.0000 mg | ORAL_TABLET | ORAL | Status: DC | PRN

## 2014-06-16 MED ORDER — DIPHENHYDRAMINE HCL 25 MG PO CAPS: 25.0000 mg | ORAL_CAPSULE | Freq: Four times a day (QID) | ORAL | Status: DC | PRN

## 2014-06-16 MED ORDER — ONDANSETRON HCL 4 MG PO TABS: 4.0000 mg | ORAL_TABLET | ORAL | Status: DC | PRN

## 2014-06-16 MED ORDER — OXYCODONE-ACETAMINOPHEN 5-325 MG PO TABS
2.0000 | ORAL_TABLET | ORAL | Status: DC | PRN
  Administered 2014-06-17: 2 via ORAL
  Filled 2014-06-16 (×2): qty 2

## 2014-06-16 MED ORDER — ONDANSETRON HCL 4 MG/2ML IJ SOLN
4.0000 mg | INTRAMUSCULAR | Status: DC | PRN
Start: 2014-06-16 — End: 2014-06-18

## 2014-06-16 MED ORDER — PRENATAL MULTIVITAMIN CH
1.0000 | ORAL_TABLET | Freq: Every day | ORAL | Status: DC
  Administered 2014-06-16 – 2014-06-18 (×3): 1 via ORAL
  Filled 2014-06-16 (×3): qty 1

## 2014-06-16 NOTE — Progress Notes (Signed)
BBOW  SROM

## 2014-06-16 NOTE — Progress Notes (Signed)
This note also relates to the following rows which could not be included: Pulse Rate - Cannot attach notes to unvalidated device data SpO2 - Cannot attach notes to unvalidated device data   Patient feels like she is "going to cough baby out"

## 2014-06-16 NOTE — Progress Notes (Signed)
Clinical Social Work Department PSYCHOSOCIAL ASSESSMENT - MATERNAL/CHILD 06/16/2014  Patient:  Ellen Hall, Ellen Hall  Account Number:  [id number]  Admit Date:  06/15/2014  Ellen Hall Name:   Ellen Hall    Clinical Social Worker:  Ash Mcelwain, LCSW   Date/Time:  06/16/2014 03:30 PM  Date Referred:  06/15/2014   Referral source  Central Nursery     Referred reason  Substance Abuse   Other referral source:    I:  FAMILY / [place] legal guardian:  PARENT  Guardian - Name Guardian - Age Guardian - Address  [facility] M [address] [place], [place] [address]  Ellen Hall [address]   Other household support members/support persons Other support:    II  PSYCHOSOCIAL DATA Information Source:    Occupational hygienist Employment:   supported by maternal and paternal family   Financial resources:  Medicaid If [place]:   Librarian, academic Has [employer] appointment on Monday   School / Grade:   Maternity Care Coordinator / Child Services Coordination / Early Interventions:  Cultural issues impacting care:    III  STRENGTHS Strengths  Supportive family/friends  Home prepared for Child (including basic supplies)  Adequate Resources   Strength comment:    IV  RISK FACTORS AND CURRENT PROBLEMS Current Problem:     Risk Factor & Current Problem Patient Issue Family Issue Risk Factor / Current Problem Comment  Substance Abuse Y N Mother positive for marijuana and cocaine    V  SOCIAL WORK ASSESSMENT Acknowledged order for social work consult to address concerns regarding hx of substance abuse.  Mother was positive for cocaine and marijuana on admission.   Met with mother who was pleasant and receptive to CSW.    She is a single parent with 4 other dependents ages 64, 8, 1, and 1. Informed that she and all of her children are living with maternal grandmother Ellen Hall.  Mother states that she has always maintained custody of her  children.  She admits to regular use of marijuana, but only occasional use of cocaine.  Informed that she would mostly smoke marijuana on the weekend.  She reports last use of cocaine about  4 months ago.  She denies any other illicit drug use.  Mother reports limited [facility] - only 3 visits with [facility] on [address].  Informed that she intended to have an abortion, but did not have the funds to pay for it and she was too far along in the pregnancy.   Mother states that she is happy with newborn and have no regrets about not having the abortion.     She denies any current involvement with DSS. Informed that she was reported to DSS after she had her 27 year old because he was positive for marijuana, but that case has since been closed.  She denies any drug use in the home or in the presence of her children.   She denies any treatment history and states that she does not see a need for treatment at this time.   Discouraged continued illicit drug use.   She denies any hx of mental illness.  Affect and behavior was appropriate through out the assessment.  Good bonding noted with newborn.  UDS on newborn was negative.   Mother informed of social work Fish farm manager.      VI SOCIAL WORK PLAN Social Work Plan  No Barriers to Discharge   Type of pt/family education:  Hospital's drug screen policy  Criteria for DSS referral   If child protective services report - county:   If child protective services report - date:   Information/referral to community resources comment:   Other social work plan:   Will continue to monitor drug screen

## 2014-06-16 NOTE — Progress Notes (Signed)
This note also relates to the following rows which could not be included: Pulse Rate - Cannot attach notes to unvalidated device data SpO2 - Cannot attach notes to unvalidated device data   Second RN into room.

## 2014-06-16 NOTE — Anesthesia Postprocedure Evaluation (Signed)
  Anesthesia Post-op Note  Patient: Ellen Hall  Procedure(s) Performed: * No procedures listed *  Patient Location: PACU and Mother/Baby  Anesthesia Type:Epidural  Level of Consciousness: awake, alert  and oriented  Airway and Oxygen Therapy: Patient Spontanous Breathing  Post-op Pain: mild  Post-op Assessment: Post-op Vital signs reviewed  Post-op Vital Signs: Reviewed and stable  Last Vitals:  Filed Vitals:   06/16/14 0850  BP: 120/52  Pulse: 61  Temp: 36.8 C  Resp: 18    Complications: No apparent anesthesia complications

## 2014-06-17 LAB — HEPATITIS B SURFACE ANTIBODY, QUANTITATIVE

## 2014-06-18 MED ORDER — IBUPROFEN 600 MG PO TABS: 600.0000 mg | ORAL_TABLET | Freq: Four times a day (QID) | ORAL | Status: DC

## 2014-06-18 NOTE — Progress Notes (Signed)
CSW spoke with [place] CPS Intake.    Per Intake, MOB does not have an open CPS case.   No barriers to discharge at this time.  

## 2014-06-18 NOTE — Progress Notes (Signed)
Ur chart review completed.  

## 2014-06-18 NOTE — Discharge Summary (Signed)
Obstetric Discharge Summary Reason for Admission: onset of labor and preterm at 36.1, UDS pos for cocaine and THC, no PNC Prenatal Procedures: none Intrapartum Procedures: spontaneous vaginal delivery Postpartum Procedures: none Complications-Operative and Postpartum: none HEMOGLOBIN  Date Value Ref Range Status  06/15/2014 11.6* 12.0 - 15.0 g/dL Final   HCT  Date Value Ref Range Status  06/15/2014 34.4* 36.0 - 46.0 % Final    Physical Exam:  General: alert, cooperative, appears stated age and no distress Lochia: appropriate Uterine Fundus: firm Incision: n/a DVT Evaluation: No evidence of DVT seen on physical exam. Negative Homan's sign. No cords or calf tenderness. No significant calf/ankle edema.  Discharge Diagnoses: Premature labor  Discharge Information: Date: 06/18/2014 Activity: pelvic rest Diet: routine Medications: PNV and Ibuprofen Condition: stable and improved Instructions: refer to practice specific booklet Discharge to: home   Newborn Data: Live born female  Birth Weight: 7 lb 0.7 oz (3195 g) APGAR: 9, 9  Home with mother.  Wyvonnia DuskyLAWSON, [place] DARLENE 06/18/2014, 7:41 AM

## 2014-06-19 LAB — RUBELLA SCREEN: RUBELLA: 1.87 {index} (ref >0.99)

## 2015-03-17 NOTE — L&D Delivery Note (Signed)
30 y.o. [id number] at 1814w6d delivered a viable female infant in cephalic, ROA position at 00:27. No nuchal cord. Left anterior shoulder delivered with ease. 60 sec delayed cord clamping. Cord clamped x2 and cut. Placenta delivered spontaneously intact, with 3VC. Fundus firm on exam with massage and pitocin. Good hemostasis noted.  Laceration: None Suture: N/A Good hemostasis noted.  Mom and baby recovering in LDR.    Apgars: pending (see delivery summary) Weight: pending (see delivery summary)    Jen MowElizabeth Tamu Golz, DO OB Fellow [facility] 11/05/2015, 1:07 AM

## 2015-10-10 ENCOUNTER — Encounter (HOSPITAL_COMMUNITY): Payer: Self-pay | Admitting: Obstetrics & Gynecology

## 2015-10-10 LAB — OB RESULTS CONSOLE HEPATITIS B SURFACE ANTIGEN: Hepatitis B Surface Ag: NEGATIVE

## 2015-10-10 LAB — OB RESULTS CONSOLE HIV ANTIBODY (ROUTINE TESTING): HIV: NONREACTIVE

## 2015-10-10 LAB — OB RESULTS CONSOLE RPR: RPR: NONREACTIVE

## 2015-10-10 LAB — OB RESULTS CONSOLE RUBELLA ANTIBODY, IGM: RUBELLA: IMMUNE

## 2015-11-04 ENCOUNTER — Encounter (HOSPITAL_COMMUNITY): Payer: Self-pay | Admitting: *Deleted

## 2015-11-04 ENCOUNTER — Inpatient Hospital Stay (HOSPITAL_COMMUNITY): Payer: Medicaid Other | Admitting: Anesthesiology

## 2015-11-04 ENCOUNTER — Inpatient Hospital Stay (HOSPITAL_COMMUNITY)
Admission: AD | Admit: 2015-11-04 | Discharge: 2015-11-07 | DRG: 767 | Disposition: A | Discharge status: Home / Self Care | Payer: Medicaid Other | Source: Ambulatory Visit | Attending: Family Medicine | Admitting: Family Medicine

## 2015-11-04 DIAGNOSIS — F1990 Other psychoactive substance use, unspecified, uncomplicated: Secondary | ICD-10-CM | POA: Diagnosis present

## 2015-11-04 DIAGNOSIS — Z3A36 36 weeks gestation of pregnancy: Secondary | ICD-10-CM

## 2015-11-04 DIAGNOSIS — Z302 Encounter for sterilization: Secondary | ICD-10-CM

## 2015-11-04 DIAGNOSIS — Z87891 Personal history of nicotine dependence: Secondary | ICD-10-CM

## 2015-11-04 DIAGNOSIS — IMO0001 Reserved for inherently not codable concepts without codable children: Secondary | ICD-10-CM

## 2015-11-04 DIAGNOSIS — Z833 Family history of diabetes mellitus: Secondary | ICD-10-CM

## 2015-11-04 DIAGNOSIS — O99324 Drug use complicating childbirth: Secondary | ICD-10-CM | POA: Diagnosis present

## 2015-11-04 LAB — CBC
HEMATOCRIT: 32.7 % — AB (ref 36.0–46.0)
Hemoglobin: 11.1 g/dL — ABNORMAL LOW (ref 12.0–15.0)
MCH: 33.5 pg (ref 26.0–34.0)
MCHC: 33.9 g/dL (ref 30.0–36.0)
MCV: 98.8 fL (ref 78.0–100.0)
Platelets: 149 10*3/uL — ABNORMAL LOW (ref 150–400)
RBC: 3.31 MIL/uL — ABNORMAL LOW (ref 3.87–5.11)
RDW: 13.1 % (ref 11.5–15.5)
WBC: 9.2 10*3/uL (ref 4.0–10.5)

## 2015-11-04 LAB — RAPID URINE DRUG SCREEN, HOSP PERFORMED
Amphetamines: NOT DETECTED
BARBITURATES: NOT DETECTED
BENZODIAZEPINES: NOT DETECTED
COCAINE: POSITIVE — AB
OPIATES: NOT DETECTED
Tetrahydrocannabinol: POSITIVE — AB

## 2015-11-04 LAB — GROUP B STREP BY PCR: GROUP B STREP BY PCR: NEGATIVE

## 2015-11-04 LAB — TYPE AND SCREEN
ABO/RH(D): O POS
Antibody Screen: NEGATIVE

## 2015-11-04 LAB — OB RESULTS CONSOLE GBS: GBS: NEGATIVE

## 2015-11-04 MED ORDER — LACTATED RINGERS IV SOLN: 500.0000 mL | INTRAVENOUS | Status: DC | PRN

## 2015-11-04 MED ORDER — PHENYLEPHRINE 40 MCG/ML (10ML) SYRINGE FOR IV PUSH (FOR BLOOD PRESSURE SUPPORT): 80.0000 ug | PREFILLED_SYRINGE | INTRAVENOUS | Status: DC | PRN

## 2015-11-04 MED ORDER — PHENYLEPHRINE 40 MCG/ML (10ML) SYRINGE FOR IV PUSH (FOR BLOOD PRESSURE SUPPORT)
80.0000 ug | PREFILLED_SYRINGE | INTRAVENOUS | Status: DC | PRN
  Filled 2015-11-04: qty 10

## 2015-11-04 MED ORDER — OXYTOCIN 40 UNITS IN LACTATED RINGERS INFUSION - SIMPLE MED
1.0000 m[IU]/min | INTRAVENOUS | Status: DC
  Administered 2015-11-04: 2 m[IU]/min via INTRAVENOUS

## 2015-11-04 MED ORDER — OXYCODONE-ACETAMINOPHEN 5-325 MG PO TABS: 1.0000 | ORAL_TABLET | ORAL | Status: DC | PRN

## 2015-11-04 MED ORDER — LIDOCAINE HCL (PF) 1 % IJ SOLN
INTRAMUSCULAR | Status: DC | PRN
  Administered 2015-11-04: 4 mL
  Administered 2015-11-04: 6 mL via EPIDURAL

## 2015-11-04 MED ORDER — LACTATED RINGERS IV SOLN
INTRAVENOUS | Status: DC
  Administered 2015-11-04 (×2): via INTRAVENOUS

## 2015-11-04 MED ORDER — AMPICILLIN SODIUM 2 G IJ SOLR
2.0000 g | Freq: Once | INTRAMUSCULAR | Status: AC
  Administered 2015-11-04: 2 g via INTRAVENOUS
  Filled 2015-11-04: qty 2000

## 2015-11-04 MED ORDER — OXYTOCIN 40 UNITS IN LACTATED RINGERS INFUSION - SIMPLE MED
2.5000 [IU]/h | INTRAVENOUS | Status: DC
  Filled 2015-11-04 (×2): qty 1000

## 2015-11-04 MED ORDER — LIDOCAINE HCL (PF) 1 % IJ SOLN
30.0000 mL | INTRAMUSCULAR | Status: DC | PRN
  Filled 2015-11-04: qty 30

## 2015-11-04 MED ORDER — FENTANYL 2.5 MCG/ML BUPIVACAINE 1/10 % EPIDURAL INFUSION (WH - ANES)
14.0000 mL/h | INTRAMUSCULAR | Status: DC | PRN
Start: 2015-11-04 — End: 2015-11-05
  Administered 2015-11-04 (×2): 14 mL/h via EPIDURAL
  Filled 2015-11-04 (×2): qty 125

## 2015-11-04 MED ORDER — SOD CITRATE-CITRIC ACID 500-334 MG/5ML PO SOLN: 30.0000 mL | ORAL | Status: DC | PRN

## 2015-11-04 MED ORDER — DIPHENHYDRAMINE HCL 50 MG/ML IJ SOLN
12.5000 mg | INTRAMUSCULAR | Status: DC | PRN
  Administered 2015-11-04: 12.5 mg via INTRAVENOUS
  Filled 2015-11-04: qty 1

## 2015-11-04 MED ORDER — OXYTOCIN BOLUS FROM INFUSION
500.0000 mL | Freq: Once | INTRAVENOUS | Status: DC
  Administered 2015-11-05: 500 mL via INTRAVENOUS

## 2015-11-04 MED ORDER — TERBUTALINE SULFATE 1 MG/ML IJ SOLN: 0.2500 mg | Freq: Once | INTRAMUSCULAR | Status: DC | PRN

## 2015-11-04 MED ORDER — EPHEDRINE 5 MG/ML INJ: 10.0000 mg | INTRAVENOUS | Status: DC | PRN

## 2015-11-04 MED ORDER — OXYCODONE-ACETAMINOPHEN 5-325 MG PO TABS: 2.0000 | ORAL_TABLET | ORAL | Status: DC | PRN

## 2015-11-04 MED ORDER — ACETAMINOPHEN 325 MG PO TABS: 650.0000 mg | ORAL_TABLET | ORAL | Status: DC | PRN

## 2015-11-04 MED ORDER — LACTATED RINGERS IV SOLN
500.0000 mL | Freq: Once | INTRAVENOUS | Status: AC
  Administered 2015-11-04: 500 mL via INTRAVENOUS

## 2015-11-04 MED ORDER — ONDANSETRON HCL 4 MG/2ML IJ SOLN
4.0000 mg | Freq: Four times a day (QID) | INTRAMUSCULAR | Status: DC | PRN
  Administered 2015-11-04: 4 mg via INTRAVENOUS
  Filled 2015-11-04: qty 2

## 2015-11-04 NOTE — MAU Note (Signed)
Contractions since yesterday. Worse since 0600. Little bit of spotting, no leaking of fluid.

## 2015-11-04 NOTE — H&P (Signed)
LABOR AND DELIVERY ADMISSION HISTORY AND PHYSICAL NOTE  Ellen Hall is a 30 y.o. female [phone] with IUP at 2981w5d by LMP presenting for active labor.   She reports positive fetal movement. She denies leakage of fluid or vaginal bleeding.  Prenatal History/Complications:  Limited prenatal care Antepartum chlamydia (treated, but no TOC) Substance use (UDS 3showing cocaine, marijuana, nicotine)  Past Medical History: Past Medical History:  Diagnosis Date  . BV (bacterial vaginosis)   . Late prenatal care     Past Surgical History: Past Surgical History:  Procedure Laterality Date  . NO PAST SURGERIES      Obstetrical History: OB History    Gravida Para Term Preterm AB Living   6 5 4 1   5    SAB TAB Ectopic Multiple Live Births         0 5      Social History: Social History   Social History  . Marital status: Single    Spouse name: N/A  . Number of children: N/A  . Years of education: N/A   Social History Main Topics  . Smoking status: Former Games developermoker  . Smokeless tobacco: Never Used  . Alcohol use Yes     Comment: Once during pregnancy  . Drug use:     Types: Marijuana     Comment: 1 month ago  . Sexual activity: Not Currently   Other Topics Concern  . None   Social History Narrative  . None    Family History: Family History  Problem Relation Age of Onset  . Diabetes Mother   . Diabetes Sister   . Diabetes Maternal Grandmother     Allergies: No Known Allergies  Prescriptions Prior to Admission  Medication Sig Dispense Refill Last Dose  . acetaminophen (TYLENOL) 500 MG tablet Take 1,000 mg by mouth daily as needed for mild pain or headache.   Past Week at Unknown time  . ibuprofen (ADVIL,MOTRIN) 600 MG tablet Take 1 tablet (600 mg total) by mouth every 6 (six) hours. 30 tablet 0   . prenatal vitamin w/FE, FA (PRENATAL 1 + 1) 27-1 MG TABS Take 1 tablet by mouth daily. 30 each 0 06/15/2014 at Unknown time     Review of Systems   All systems  reviewed and negative except as stated in HPI  Blood pressure 124/73, pulse 61, temperature 97.6 F (36.4 C), temperature source Oral, height 5\' 3"  (1.6 m), weight 71.2 kg (157 lb), unknown if currently breastfeeding. General appearance: alert, cooperative and no distress Lungs: no respiratory distress Heart: regular rate Abdomen: soft, non-tender Extremities: No calf swelling or tenderness Presentation: cephalic by cervical exam Fetal monitoring: 130, moderate variability, + accels, no decels Uterine activity: ctx q5 minutes (per patient) Dilation: 5.5 Effacement (%): 80 Station: -2 Exam by:: Sarajane MarekS. Carrera, RNC   Prenatal labs: ABO, Rh:  O+ Antibody:  Negative Rubella: Immune RPR:   Negative HBsAg:   Negative HIV:   Negative GBS:   PCR pending Genetic screening:  Not done Anatomy US: Normal  Prenatal Transfer Tool  Maternal Diabetes: No Genetic Screening: Declined Maternal Ultrasounds/Referrals: Normal Fetal Ultrasounds or other Referrals:  None Maternal Substance Abuse:  Yes:  Type: Smoker, Marijuana, Cocaine Significant Maternal Medications:  None Significant Maternal Lab Results: Lab values include: Other: Group B strep unknown  No results found for this or any previous visit (from the past 24 hour(s)).  Patient Active Problem List   Diagnosis Date Noted  . No prenatal care in  current pregnancy in third trimester   . Encounter for fetal anatomic survey   . [redacted] weeks gestation of pregnancy   . Active labor 06/15/2014  . Abnormal O'Sullivan glucose challenge test, antepartum 10/25/2012  . Post term pregnancy, antepartum 10/25/2012  . Need for rubella vaccination 09/12/2012  . Insufficient prenatal care in third trimester 08/16/2012    Assessment: Ellen Sellersasha M Hall is a 30 y.o. [phone] at 3211w5d here for active labor.  #Labor: Expectant management #Pain: Epidural #FWB: Category I #ID: GBS Unknown, PCR pending #MOF: Formula #MOC: BTL #Circ:  N/A   Leland HerElsia J Yoo,  DO PGY-1 8/21/201710:09 AM   Midwife attestation: I have seen and examined this patient; I agree with above documentation in the resident's note.   Ellen Hall is a 30 y.o. [phone] here for SOL.  PE: BP 123/65 (BP Location: Left Arm)   Pulse (!) 59   Temp 98.1 F (36.7 C) (Oral)   Resp 18   Ht 5\' 3"  (1.6 m)   Wt 157 lb (71.2 kg)   LMP 02/20/2015 (Approximate)   SpO2 100%   Breastfeeding? Unknown   BMI 27.81 kg/m  Gen: calm comfortable, NAD Resp: normal effort, no distress Abd: gravid  ROS, labs, PMH reviewed  Plan: Admit to LD Labor: expectant management FWB: Category I ID: GBS unknown. PCR ordered in error, although results negative. Because accuracy of rapid test is in question and CDC guidelines recommend treatment for preterm unknown GBS, ampicillin 2 g IV administered for advanced labor dilation and bolused by RN.  Will continue to expectantly manage with pt comfortable following epidural for optimal effectiveness of GBS prophylaxis.  LEFTWICH-KIRBY, Montee Tallman, CNM  11/06/2015, 8:59 AM

## 2015-11-04 NOTE — Progress Notes (Signed)
Patient ID: Ellen Hall, female   DOB: 04/09/1985, 30 y.o.   MRN: [id number] LABOR PROGRESS NOTE  Ellen Hall is a 30 y.o. [id number] at 7559w5d  admitted for active labor.  Subjective: Doing well, epidural started, pain well-controlled.  Objective: BP (!) 108/54   Pulse (!) 58   Temp 98 F (36.7 C) (Oral)   Ht 5\' 3"  (1.6 m)   Wt 71.2 kg (157 lb)   LMP 02/20/2015 (Approximate)   SpO2 99%   BMI 27.81 kg/m  or  Vitals:   11/04/15 1140 11/04/15 1145 11/04/15 1150 11/04/15 1201  BP: 112/62 (!) 106/57 102/62 (!) 108/54  Pulse: (!) 59 61 62 (!) 58  Temp:      TempSrc:      SpO2: 100% 100% 99%   Weight:      Height:       Dilation: 7.5 Effacement (%): 90 Cervical Position: Middle Station: -3 Presentation: Vertex Exam by:: Craige CottaAmanda Loye, RN FHT: 130, moderate variability, + accels, no decels. Uterine activity: contractions q5 minutes  Labs: Lab Results  Component Value Date   WBC 9.2 11/04/2015   HGB 11.1 (L) 11/04/2015   HCT 32.7 (L) 11/04/2015   MCV 98.8 11/04/2015   PLT 149 (L) 11/04/2015    Patient Active Problem List   Diagnosis Date Noted  . Active labor at term 11/04/2015  . No prenatal care in current pregnancy in third trimester   . Encounter for fetal anatomic survey   . [redacted] weeks gestation of pregnancy   . Active labor 06/15/2014  . Abnormal O'Sullivan glucose challenge test, antepartum 10/25/2012  . Post term pregnancy, antepartum 10/25/2012  . Need for rubella vaccination 09/12/2012  . Insufficient prenatal care in third trimester 08/16/2012    Assessment / Plan: 30 y.o. [id number] at 6259w5d here for spontaneous labor. Performed GC/chlamydia swab for TOC, patient tolerated well.  Labor: Expectant management Fetal Wellbeing:  Category I Pain Control:  Epidural Anticipated MOD:  SVD  Leland HerElsia J Hennie Gosa, DO PGY-1 8/21/201712:17 PM

## 2015-11-04 NOTE — Progress Notes (Signed)
[facility] Pt began care and had first appointment on 10/10/2015 for this pregnancy. Lab work was collected on this visit and will be faxed over along with the ultrasound report from 10/14/15. These are the only two visits that the patient has had this pregnancy according to their records. Among the lab work completed on 10/10/2015 there was a positive Chlamydia and positive UDS for marijuana, cocaine, and alcohol. On admission patient denied any substance abuse this pregnancy and stated that she has had routine prenatal care. Will enter lab results from prenatal visit [place] [facility] [place] [facility]

## 2015-11-04 NOTE — Anesthesia Procedure Notes (Signed)

## 2015-11-04 NOTE — MAU Note (Signed)
Patient states she has received prenatal care at health department, but no prenatal record in media. When asked if she has been receiving prenatal care all along, patient answered "Yes."

## 2015-11-04 NOTE — Anesthesia Preprocedure Evaluation (Signed)

## 2015-11-04 NOTE — Progress Notes (Signed)
Patient ID: Ellen Hall, female   DOB: 08/30/1985, 30 y.o.   MRN: [id number] LABOR PROGRESS NOTE  Ellen Hall is a 30 y.o. [id number] at 4467w5d  admitted for active labor.  Subjective: Doing well. Epidural running and pain well-controlled. Amniotomy performed around 1745 and tolerated well.   Objective: BP 103/69   Pulse 66   Temp 97.9 F (36.6 C) (Oral)   Resp 16   Ht 5\' 3"  (1.6 m)   Wt 71.2 kg (157 lb)   LMP 02/20/2015 (Approximate)   SpO2 99%   BMI 27.81 kg/m  or  Vitals:   11/04/15 1616 11/04/15 1632 11/04/15 1701 11/04/15 1731  BP: (!) 97/44 97/72 110/88 103/69  Pulse: 60 68  66  Resp: 16 16 16    Temp:   97.9 F (36.6 C)   TempSrc:   Oral   SpO2:      Weight:      Height:       Dilation: 8 Effacement (%): 90 Cervical Position: Middle Station: -1, -2 Presentation: Vertex Exam by:: Craige CottaAmanda Loye, RN FHT: 150, moderate variability, + accels, no decels. Uterine activity: contractions q5 minutes  Labs: Lab Results  Component Value Date   WBC 9.2 11/04/2015   HGB 11.1 (L) 11/04/2015   HCT 32.7 (L) 11/04/2015   MCV 98.8 11/04/2015   PLT 149 (L) 11/04/2015    Patient Active Problem List   Diagnosis Date Noted  . Active labor at term 11/04/2015  . No prenatal care in current pregnancy in third trimester   . Encounter for fetal anatomic survey   . [redacted] weeks gestation of pregnancy   . Active labor 06/15/2014  . Abnormal O'Sullivan glucose challenge test, antepartum 10/25/2012  . Post term pregnancy, antepartum 10/25/2012  . Need for rubella vaccination 09/12/2012  . Insufficient prenatal care in third trimester 08/16/2012    Assessment / Plan: 30 y.o. [id number] at 667w5d here for spontaneous labor.   Labor: Expectant management Fetal Wellbeing: Category I Pain Control:  Epidural, currently well-controlled Anticipated MOD:  SVD  Ambrose FinlandGabriela E Arshia Spellman, Medical Student  8/21/20175:33 PM

## 2015-11-04 NOTE — Progress Notes (Signed)
Ellen Hall is a 30 y.o. [phone] at 1348w5d   Subjective: Comfortable with epidural  Objective: BP 103/69   Pulse 66   Temp 97.9 F (36.6 C) (Oral)   Resp 16   Ht 5\' 3"  (1.6 m)   Wt 157 lb (71.2 kg)   LMP 02/20/2015 (Approximate)   SpO2 99%   BMI 27.81 kg/m  No intake/output data recorded. No intake/output data recorded.  FHT:  FHR: 140 bpm, variability: moderate,  accelerations:  Present,  decelerations:  Absent UC:   regular, every 3-5 minutes SVE:   Dilation: 8 Effacement (%): 90 Station: -1, -2 Exam by:: Craige CottaAmanda Loye, RN  Labs: Lab Results  Component Value Date   WBC 9.2 11/04/2015   HGB 11.1 (L) 11/04/2015   HCT 32.7 (L) 11/04/2015   MCV 98.8 11/04/2015   PLT 149 (L) 11/04/2015    Assessment / Plan: AROM with clear fluid. If undelivered in 1 hr, placed IUPC and start pitocin if indicated.  Binnie Droessler JEHIEL 11/04/2015, 5:45 PM

## 2015-11-04 NOTE — Anesthesia Pain Management Evaluation Note (Signed)
  CRNA Pain Management Visit Note  Patient: Ellen Hall, 30 y.o., female  "Hello I am a member of the anesthesia team at [facility]. We have an anesthesia team available at all times to provide care throughout the hospital, including epidural management and anesthesia for C-section. I don't know your plan for the delivery whether it a natural birth, water birth, IV sedation, nitrous supplementation, doula or epidural, but we want to meet your pain goals."   1.Was your pain managed to your expectations on prior hospitalizations?   Yes   2.What is your expectation for pain management during this hospitalization?     Epidural  3.How can we help you reach that goal? Epidural in situ.  Record the patient's initial score and the patient's pain goal.   Pain: 0  Pain Goal: 2 [facility] wants you to be able to say your pain was always managed very well.  Antwion Carpenter L 11/04/2015

## 2015-11-05 ENCOUNTER — Encounter (HOSPITAL_COMMUNITY)
Admission: AD | Disposition: A | Discharge status: Home / Self Care | Payer: Self-pay | Source: Ambulatory Visit | Attending: Family Medicine

## 2015-11-05 ENCOUNTER — Encounter (HOSPITAL_COMMUNITY): Payer: Self-pay | Admitting: *Deleted

## 2015-11-05 ENCOUNTER — Inpatient Hospital Stay (HOSPITAL_COMMUNITY): Payer: Medicaid Other | Admitting: Anesthesiology

## 2015-11-05 DIAGNOSIS — F1990 Other psychoactive substance use, unspecified, uncomplicated: Secondary | ICD-10-CM

## 2015-11-05 DIAGNOSIS — O99324 Drug use complicating childbirth: Secondary | ICD-10-CM

## 2015-11-05 DIAGNOSIS — Z87891 Personal history of nicotine dependence: Secondary | ICD-10-CM

## 2015-11-05 DIAGNOSIS — Z302 Encounter for sterilization: Secondary | ICD-10-CM

## 2015-11-05 DIAGNOSIS — Z3A36 36 weeks gestation of pregnancy: Secondary | ICD-10-CM

## 2015-11-05 HISTORY — PX: TUBAL LIGATION: SHX77

## 2015-11-05 LAB — RPR: RPR: NONREACTIVE

## 2015-11-05 LAB — GC/CHLAMYDIA PROBE AMP (~~LOC~~) NOT AT ARMC
CHLAMYDIA, DNA PROBE: NEGATIVE
Neisseria Gonorrhea: NEGATIVE

## 2015-11-05 LAB — CBC
HEMATOCRIT: 28.5 % — AB (ref 36.0–46.0)
HEMOGLOBIN: 9.6 g/dL — AB (ref 12.0–15.0)
MCH: 33.2 pg (ref 26.0–34.0)
MCHC: 33.7 g/dL (ref 30.0–36.0)
MCV: 98.6 fL (ref 78.0–100.0)
PLATELETS: 135 10*3/uL — AB (ref 150–400)
RBC: 2.89 MIL/uL — AB (ref 3.87–5.11)
RDW: 13 % (ref 11.5–15.5)
WBC: 18.4 10*3/uL — ABNORMAL HIGH (ref 4.0–10.5)

## 2015-11-05 SURGERY — LIGATION, FALLOPIAN TUBE, POSTPARTUM
Anesthesia: Epidural | Site: Abdomen | Laterality: Bilateral

## 2015-11-05 MED ORDER — IBUPROFEN 600 MG PO TABS
600.0000 mg | ORAL_TABLET | Freq: Four times a day (QID) | ORAL | Status: DC
  Administered 2015-11-05 – 2015-11-07 (×10): 600 mg via ORAL
  Filled 2015-11-05 (×10): qty 1

## 2015-11-05 MED ORDER — DIPHENHYDRAMINE HCL 25 MG PO CAPS: 25.0000 mg | ORAL_CAPSULE | Freq: Four times a day (QID) | ORAL | Status: DC | PRN

## 2015-11-05 MED ORDER — ONDANSETRON HCL 4 MG PO TABS: 4.0000 mg | ORAL_TABLET | ORAL | Status: DC | PRN

## 2015-11-05 MED ORDER — SODIUM CHLORIDE 0.9% FLUSH: 3.0000 mL | Freq: Two times a day (BID) | INTRAVENOUS | Status: DC

## 2015-11-05 MED ORDER — LACTATED RINGERS IV SOLN
INTRAVENOUS | Status: DC
  Administered 2015-11-05: 1000 mL via INTRAVENOUS
  Administered 2015-11-05: 10:00:00 via INTRAVENOUS

## 2015-11-05 MED ORDER — ONDANSETRON HCL 4 MG/2ML IJ SOLN
INTRAMUSCULAR | Status: AC
  Filled 2015-11-05: qty 2

## 2015-11-05 MED ORDER — SCOPOLAMINE 1 MG/3DAYS TD PT72
1.0000 | MEDICATED_PATCH | TRANSDERMAL | Status: DC
  Administered 2015-11-05: 1.5 mg via TRANSDERMAL

## 2015-11-05 MED ORDER — FENTANYL CITRATE (PF) 100 MCG/2ML IJ SOLN
INTRAMUSCULAR | Status: DC | PRN
  Administered 2015-11-05 (×2): 50 ug via INTRAVENOUS

## 2015-11-05 MED ORDER — LIDOCAINE-EPINEPHRINE (PF) 2 %-1:200000 IJ SOLN
INTRAMUSCULAR | Status: AC
  Filled 2015-11-05: qty 20

## 2015-11-05 MED ORDER — MEPERIDINE HCL 25 MG/ML IJ SOLN: 6.2500 mg | INTRAMUSCULAR | Status: DC | PRN

## 2015-11-05 MED ORDER — LIDOCAINE-EPINEPHRINE (PF) 2 %-1:200000 IJ SOLN
INTRAMUSCULAR | Status: DC | PRN
  Administered 2015-11-05: 8 mL
  Administered 2015-11-05: 2 mL
  Administered 2015-11-05 (×2): 5 mL

## 2015-11-05 MED ORDER — FENTANYL CITRATE (PF) 100 MCG/2ML IJ SOLN
INTRAMUSCULAR | Status: AC
  Filled 2015-11-05: qty 2

## 2015-11-05 MED ORDER — SODIUM CHLORIDE 0.9% FLUSH: 3.0000 mL | INTRAVENOUS | Status: DC | PRN

## 2015-11-05 MED ORDER — PROPOFOL 10 MG/ML IV BOLUS
INTRAVENOUS | Status: DC | PRN
  Administered 2015-11-05 (×4): 20 mg via INTRAVENOUS

## 2015-11-05 MED ORDER — ONDANSETRON HCL 4 MG/2ML IJ SOLN
4.0000 mg | INTRAMUSCULAR | Status: DC | PRN
  Administered 2015-11-05: 4 mg via INTRAVENOUS

## 2015-11-05 MED ORDER — MIDAZOLAM HCL 5 MG/5ML IJ SOLN
INTRAMUSCULAR | Status: DC | PRN
  Administered 2015-11-05 (×2): 1 mg via INTRAVENOUS

## 2015-11-05 MED ORDER — OXYTOCIN 40 UNITS IN LACTATED RINGERS INFUSION - SIMPLE MED: 2.5000 [IU]/h | INTRAVENOUS | Status: DC | PRN

## 2015-11-05 MED ORDER — PRENATAL MULTIVITAMIN CH
1.0000 | ORAL_TABLET | Freq: Every day | ORAL | Status: DC
  Administered 2015-11-06 – 2015-11-07 (×2): 1 via ORAL
  Filled 2015-11-05 (×2): qty 1

## 2015-11-05 MED ORDER — OXYCODONE-ACETAMINOPHEN 5-325 MG PO TABS
1.0000 | ORAL_TABLET | ORAL | Status: DC | PRN
  Administered 2015-11-05 – 2015-11-06 (×8): 1 via ORAL
  Filled 2015-11-05 (×8): qty 1

## 2015-11-05 MED ORDER — ONDANSETRON HCL 4 MG/2ML IJ SOLN: 4.0000 mg | Freq: Once | INTRAMUSCULAR | Status: DC | PRN

## 2015-11-05 MED ORDER — SENNOSIDES-DOCUSATE SODIUM 8.6-50 MG PO TABS
2.0000 | ORAL_TABLET | ORAL | Status: DC
  Administered 2015-11-05 – 2015-11-06 (×2): 2 via ORAL
  Filled 2015-11-05 (×2): qty 2

## 2015-11-05 MED ORDER — SIMETHICONE 80 MG PO CHEW
80.0000 mg | CHEWABLE_TABLET | ORAL | Status: DC | PRN
Start: 2015-11-05 — End: 2015-11-07

## 2015-11-05 MED ORDER — BENZOCAINE-MENTHOL 20-0.5 % EX AERO
1.0000 "application " | INHALATION_SPRAY | CUTANEOUS | Status: DC | PRN
  Filled 2015-11-05 (×2): qty 56

## 2015-11-05 MED ORDER — KETOROLAC TROMETHAMINE 30 MG/ML IJ SOLN
INTRAMUSCULAR | Status: DC | PRN
  Administered 2015-11-05: 30 mg via INTRAVENOUS

## 2015-11-05 MED ORDER — PNEUMOCOCCAL VAC POLYVALENT 25 MCG/0.5ML IJ INJ
0.5000 mL | INJECTION | INTRAMUSCULAR | Status: DC
  Filled 2015-11-05: qty 0.5

## 2015-11-05 MED ORDER — FAMOTIDINE 20 MG PO TABS
40.0000 mg | ORAL_TABLET | Freq: Once | ORAL | Status: AC
  Administered 2015-11-05: 40 mg via ORAL
  Filled 2015-11-05: qty 2

## 2015-11-05 MED ORDER — TETANUS-DIPHTH-ACELL PERTUSSIS 5-2.5-18.5 LF-MCG/0.5 IM SUSP
0.5000 mL | Freq: Once | INTRAMUSCULAR | Status: AC
  Administered 2015-11-05: 0.5 mL via INTRAMUSCULAR
  Filled 2015-11-05: qty 0.5

## 2015-11-05 MED ORDER — ACETAMINOPHEN 325 MG PO TABS: 650.0000 mg | ORAL_TABLET | ORAL | Status: DC | PRN

## 2015-11-05 MED ORDER — MIDAZOLAM HCL 2 MG/2ML IJ SOLN
INTRAMUSCULAR | Status: AC
  Filled 2015-11-05: qty 2

## 2015-11-05 MED ORDER — BUPIVACAINE HCL (PF) 0.25 % IJ SOLN
INTRAMUSCULAR | Status: DC | PRN
  Administered 2015-11-05: 8 mL

## 2015-11-05 MED ORDER — DIBUCAINE 1 % RE OINT
1.0000 "application " | TOPICAL_OINTMENT | RECTAL | Status: DC | PRN
  Filled 2015-11-05: qty 28.4

## 2015-11-05 MED ORDER — WITCH HAZEL-GLYCERIN EX PADS: 1.0000 "application " | MEDICATED_PAD | CUTANEOUS | Status: DC | PRN

## 2015-11-05 MED ORDER — KETOROLAC TROMETHAMINE 30 MG/ML IJ SOLN
INTRAMUSCULAR | Status: AC
  Filled 2015-11-05: qty 1

## 2015-11-05 MED ORDER — METOCLOPRAMIDE HCL 10 MG PO TABS
10.0000 mg | ORAL_TABLET | Freq: Once | ORAL | Status: AC
  Administered 2015-11-05: 10 mg via ORAL
  Filled 2015-11-05: qty 1

## 2015-11-05 MED ORDER — LIDOCAINE HCL (CARDIAC) 20 MG/ML IV SOLN
INTRAVENOUS | Status: AC
  Filled 2015-11-05: qty 5

## 2015-11-05 MED ORDER — COCONUT OIL OIL
1.0000 "application " | TOPICAL_OIL | Status: DC | PRN
  Filled 2015-11-05: qty 120

## 2015-11-05 MED ORDER — PROPOFOL 10 MG/ML IV BOLUS
INTRAVENOUS | Status: AC
  Filled 2015-11-05: qty 20

## 2015-11-05 MED ORDER — ZOLPIDEM TARTRATE 5 MG PO TABS: 5.0000 mg | ORAL_TABLET | Freq: Every evening | ORAL | Status: DC | PRN

## 2015-11-05 MED ORDER — LIDOCAINE 2% (20 MG/ML) 5 ML SYRINGE
INTRAMUSCULAR | Status: DC | PRN
  Administered 2015-11-05: 50 mg via INTRAVENOUS

## 2015-11-05 MED ORDER — HYDROMORPHONE HCL 1 MG/ML IJ SOLN: 0.2500 mg | INTRAMUSCULAR | Status: DC | PRN

## 2015-11-05 MED ORDER — SODIUM CHLORIDE 0.9 % IV SOLN: 250.0000 mL | INTRAVENOUS | Status: DC | PRN

## 2015-11-05 SURGICAL SUPPLY — 20 items
CLIP FILSHIE TUBAL LIGA STRL (Clip) ×3 IMPLANT
CLOTH BEACON ORANGE TIMEOUT ST (SAFETY) ×3 IMPLANT
DRSG OPSITE POSTOP 3X4 (GAUZE/BANDAGES/DRESSINGS) ×3 IMPLANT
DURAPREP 26ML APPLICATOR (WOUND CARE) ×3 IMPLANT
GLOVE BIOGEL PI IND STRL 7.0 (GLOVE) ×2 IMPLANT
GLOVE BIOGEL PI INDICATOR 7.0 (GLOVE) ×4
GLOVE ECLIPSE 7.0 STRL STRAW (GLOVE) ×6 IMPLANT
GOWN STRL REUS W/TWL LRG LVL3 (GOWN DISPOSABLE) ×6 IMPLANT
NEEDLE HYPO 22GX1.5 SAFETY (NEEDLE) ×3 IMPLANT
NS IRRIG 1000ML POUR BTL (IV SOLUTION) ×3 IMPLANT
PACK ABDOMINAL MINOR (CUSTOM PROCEDURE TRAY) ×3 IMPLANT
PROTECTOR NERVE ULNAR (MISCELLANEOUS) ×4 IMPLANT
SPONGE LAP 4X18 X RAY DECT (DISPOSABLE) ×2 IMPLANT
SUT VIC AB 0 CT1 27 (SUTURE) ×3
SUT VIC AB 0 CT1 27XBRD ANBCTR (SUTURE) ×1 IMPLANT
SUT VICRYL 4-0 PS2 18IN ABS (SUTURE) ×3 IMPLANT
SYR CONTROL 10ML LL (SYRINGE) ×3 IMPLANT
TOWEL OR 17X24 6PK STRL BLUE (TOWEL DISPOSABLE) ×6 IMPLANT
TRAY FOLEY CATH SILVER 14FR (SET/KITS/TRAYS/PACK) ×3 IMPLANT
WATER STERILE IRR 1000ML POUR (IV SOLUTION) ×1 IMPLANT

## 2015-11-05 NOTE — Interval H&P Note (Signed)
History and Physical Interval Note:  11/05/2015 10:41 AM  Ellen Hall  has presented today for surgery, with the diagnosis of desires sterilization s/p SVD earlier today  The various methods of treatment have been discussed with the patient and family. After consideration of risks, benefits and other options for treatment, the patient has consented to  Procedure(s): POST PARTUM TUBAL LIGATION (Bilateral) as a surgical intervention .  The patient's history has been reviewed, patient examined, no change in status, stable for surgery.  I have reviewed the patient's chart and labs. Patient counseled, r.e. Risks benefits of BTL, including permanency of procedure, risk of failure(1:100), increased risk of ectopic.  Patient verbalized understanding and desires to proceed   Questions were answered to the patient's satisfaction.     Reva Boresanya S Phoenyx Paulsen

## 2015-11-05 NOTE — H&P (View-Only) (Signed)
Ellen Hall is a 29 y.o. G6P4105 at [redacted]w[redacted]d   Subjective: Comfortable with epidural  Objective: BP 103/69   Pulse 66   Temp 97.9 F (36.6 C) (Oral)   Resp 16   Ht 5' 3" (1.6 m)   Wt 157 lb (71.2 kg)   LMP 02/20/2015 (Approximate)   SpO2 99%   BMI 27.81 kg/m  No intake/output data recorded. No intake/output data recorded.  FHT:  FHR: 140 bpm, variability: moderate,  accelerations:  Present,  decelerations:  Absent UC:   regular, every 3-5 minutes SVE:   Dilation: 8 Effacement (%): 90 Station: -1, -2 Exam by:: Amanda Loye, RN  Labs: Lab Results  Component Value Date   WBC 9.2 11/04/2015   HGB 11.1 (L) 11/04/2015   HCT 32.7 (L) 11/04/2015   MCV 98.8 11/04/2015   PLT 149 (L) 11/04/2015    Assessment / Plan: AROM with clear fluid. If undelivered in 1 hr, placed IUPC and start pitocin if indicated.  STINSON, JACOB JEHIEL 11/04/2015, 5:45 PM  

## 2015-11-05 NOTE — Op Note (Signed)
Ellen Hall  11/04/2015 - 11/05/2015  PREOPERATIVE DIAGNOSIS:  Multiparity, undesired fertility  POSTOPERATIVE DIAGNOSIS:  Multiparity, undesired fertility  PROCEDURE:  Postpartum Bilateral Tubal Sterilization using Filshie Clips   ANESTHESIA:  Epidural and local analgesia using 0.25% Marcaine  COMPLICATIONS:  None immediate.  ESTIMATED BLOOD LOSS: 5 ml.  INDICATIONS: 30 y.o. [id number]  with undesired fertility,status post vaginal delivery, desires permanent sterilization.  Other reversible forms of contraception were discussed with patient; she declines all other modalities. Risks of procedure discussed with patient including but not limited to: risk of regret, permanence of method, bleeding, infection, injury to surrounding organs and need for additional procedures.  Failure risk of 0.5-1% with increased risk of ectopic gestation if pregnancy occurs was also discussed with patient.     FINDINGS:  Normal uterus, tubes, and ovaries.  PROCEDURE DETAILS: The patient was taken to the operating room where her spinal anesthesia was dosed up to surgical level and found to be adequate.  She was then placed in a supine position and prepped and draped in the usual sterile fashion.  After an adequate timeout was performed, attention was turned to the patient's abdomen where a small transverse skin incision was made under the umbilical fold. The incision was taken down to the layer of fascia using the scalpel, and fascia was incised, and extended bilaterally. The peritoneum was entered in a sharp fashion. The patient was placed in Trendelenburg.  A moist lap pad was used to move omentum and bowel away until the left fallopian tube was identified and grasped with a Babcock clamp, and followed out to the fimbriated end.  A Filshie clip was placed on the left fallopian tube about 2 cm from the cornu.  A similar process was carried out on the right side allowing for bilateral tubal sterilization.  Good  hemostasis was noted overall.  Local analgesia was injected into both Filshie application sites.The instruments were then removed from the patient's abdomen and the fascial incision was repaired with 0 Vicryl, and the skin was closed with a 4-0 Vicryl subcuticular stitch. The patient tolerated the procedure well.  Sponge, lap, and needle counts were correct times two.  The patient was then taken to the recovery room awake, extubated and in stable condition.  [name] MD 11/05/2015 11:18 AM

## 2015-11-05 NOTE — Transfer of Care (Signed)
Immediate Anesthesia Transfer of Care Note  Patient: Ellen Hall  Procedure(s) Performed: Procedure(s): POST PARTUM TUBAL LIGATION (Bilateral)  Patient Location: PACU  Anesthesia Type:Epidural  Level of Consciousness:  sedated, patient cooperative and responds to stimulation  Airway & Oxygen Therapy:Patient Spontanous Breathing and Patient connected to face mask oxgen  Post-op Assessment:  Report given to PACU RN and Post -op Vital signs reviewed and stable  Post vital signs:  Reviewed and stable  Last Vitals:  Vitals:   11/05/15 0348 11/05/15 0830  BP: (!) 104/49 127/73  Pulse: 66   Resp: 20 18  Temp: 36.7 C 36.8 C    Complications: No apparent anesthesia complications

## 2015-11-05 NOTE — Progress Notes (Signed)
[facility] She was told patient did not eat after midnight. Patient verified that. TIcket to ride given. Tech here to get patient.

## 2015-11-05 NOTE — Anesthesia Postprocedure Evaluation (Signed)
Anesthesia Post Note  Patient: Ellen Hall  Procedure(s) Performed: * No procedures listed *  Patient location during evaluation: Mother Baby Anesthesia Type: Epidural Level of consciousness: awake and alert Pain management: satisfactory to patient Vital Signs Assessment: post-procedure vital signs reviewed and stable Respiratory status: respiratory function stable Cardiovascular status: stable Postop Assessment: no headache, no backache, epidural receding, patient able to bend at knees, no signs of nausea or vomiting and adequate PO intake Anesthetic complications: no     Last Vitals:  Vitals:   11/05/15 1245 11/05/15 1642  BP: 138/75 112/68  Pulse: (!) 58 85  Resp: 16 18  Temp: 36.9 C 36.8 C    Last Pain:  Vitals:   11/05/15 1835  TempSrc:   PainSc: 4    Pain Goal: Patients Stated Pain Goal: 1 (11/05/15 1735)               Karleen DolphinFUSSELL,Mostyn Varnell

## 2015-11-05 NOTE — Anesthesia Preprocedure Evaluation (Signed)
Anesthesia Evaluation  Patient identified by MRN, date of birth, ID band Patient awake    Reviewed: Allergy & Precautions, NPO status , Patient's Chart, lab work & pertinent test results  Airway Mallampati: I  TM Distance: >3 FB Neck ROM: Full    Dental   Pulmonary former smoker,    Pulmonary exam normal        Cardiovascular Normal cardiovascular exam     Neuro/Psych    GI/Hepatic   Endo/Other    Renal/GU      Musculoskeletal   Abdominal   Peds  Hematology   Anesthesia Other Findings   Reproductive/Obstetrics                             Anesthesia Physical Anesthesia Plan  ASA: II  Anesthesia Plan: Epidural   Post-op Pain Management:    Induction: Intravenous  Airway Management Planned: Simple Face Mask  Additional Equipment:   Intra-op Plan:   Post-operative Plan:   Informed Consent: I have reviewed the patients History and Physical, chart, labs and discussed the procedure including the risks, benefits and alternatives for the proposed anesthesia with the patient or authorized representative who has indicated his/her understanding and acceptance.     Plan Discussed with: CRNA and Surgeon  Anesthesia Plan Comments:         Anesthesia Quick Evaluation

## 2015-11-05 NOTE — Anesthesia Preprocedure Evaluation (Addendum)
Anesthesia Evaluation  Patient identified by MRN, date of birth, ID band Patient awake    Reviewed: Allergy & Precautions, H&P , Patient's Chart, lab work & pertinent test results  Airway Mallampati: II  TM Distance: >3 FB Neck ROM: full    Dental  (+) Teeth Intact   Pulmonary former smoker,    breath sounds clear to auscultation       Cardiovascular  Rhythm:regular Rate:Normal     Neuro/Psych    GI/Hepatic   Endo/Other    Renal/GU      Musculoskeletal   Abdominal   Peds  Hematology   Anesthesia Other Findings       Reproductive/Obstetrics                             Anesthesia Physical Anesthesia Plan  ASA: II  Anesthesia Plan: Epidural   Post-op Pain Management:    Induction:   Airway Management Planned:   Additional Equipment:   Intra-op Plan:   Post-operative Plan:   Informed Consent:   Plan Discussed with:   Anesthesia Plan Comments:         Anesthesia Quick Evaluation

## 2015-11-06 NOTE — Anesthesia Postprocedure Evaluation (Signed)
Anesthesia Post Note  Patient: Ellen Hall  Procedure(s) Performed: Procedure(s) (LRB): POST PARTUM TUBAL LIGATION (Bilateral)  Patient location during evaluation: PACU Anesthesia Type: Epidural Level of consciousness: awake and alert and patient cooperative Pain management: pain level controlled Vital Signs Assessment: post-procedure vital signs reviewed and stable Respiratory status: spontaneous breathing and respiratory function stable Cardiovascular status: stable Anesthetic complications: no    Last Vitals:  Vitals:   11/06/15 0200 11/06/15 0601  BP: 109/70 123/65  Pulse: (!) 52 (!) 59  Resp: 18 18  Temp: 36.8 C 36.7 C    Last Pain:  Vitals:   11/06/15 1501  TempSrc:   PainSc: 6                  Myldred Raju DAVID

## 2015-11-06 NOTE — Progress Notes (Signed)
Post Partum Day 1  Subjective: no complaints, up ad lib, voiding, tolerating PO and + flatus. BTL also performed yesterday and tolerated well. Patient reports social work has visited and that she has a home visit scheduled upon discharge.   Objective: Blood pressure 123/65, pulse (!) 59, temperature 98.1 F (36.7 C), temperature source Oral, resp. rate 18, height 5\' 3"  (1.6 m), weight 71.2 kg (157 lb), last menstrual period 02/20/2015, SpO2 100 %, not currently feeding (formula).  Physical Exam:  General: alert, cooperative and no distress Lochia: appropriate Uterine Fundus: firm Incision: healing well DVT Evaluation: No evidence of DVT seen on physical exam.   Recent Labs  11/04/15 1030 11/05/15 0600  HGB 11.1* 9.6*  HCT 32.7* 28.5*  TOC for GC/Chlamydia: Negative  Assessment/Plan: Doing well post partum and post op day 1 for BTL. Plan for discharge today after ensuring appropriate follow-up with social work given + UDS for cocaine and THC.    LOS: 2 days   Ambrose FinlandGabriela E Reed 11/06/2015, 7:33 AM

## 2015-11-06 NOTE — Progress Notes (Signed)
Patient ID: Ellen Hall, female   DOB: 09/19/1985, 30 y.o.   MRN: [id number] POSTPARTUM PROGRESS NOTE  Post Partum Day #1 / POD #1 Subjective:  Ellen Hall is a 30 y.o. [id number] 6342w6d s/p NSVD and postpartum bilateral tubal ligation.  No acute events overnight.  Pt denies problems with ambulating, voiding or po intake.  She denies nausea or vomiting.  Pain is well controlled.  She has had flatus. She has had bowel movement.  Lochia Small.   Objective: Blood pressure 123/65, pulse (!) 59, temperature 98.1 F (36.7 C), temperature source Oral, resp. rate 18, height 5\' 3"  (1.6 m), weight 157 lb (71.2 kg), last menstrual period 02/20/2015, SpO2 100 %, unknown if currently breastfeeding.  Physical Exam:  General: alert, cooperative and no distress Lochia:normal flow Chest: CTAB Heart: RRR no m/r/g Abdomen: +BS, soft, nontender,  Uterine Fundus: firm, below umbilicus DVT Evaluation: No calf swelling or tenderness Extremities: No edema   Recent Labs  11/04/15 1030 11/05/15 0600  HGB 11.1* 9.6*  HCT 32.7* 28.5*    Assessment/Plan:  ASSESSMENT: Ellen Hall is a 30 y.o. [id number] 6342w6d s/p NSVD with postpartum bilateral tubal ligation.  Plan for discharge tomorrow, Social Work consult and Contraception BTL   LOS: 2 days   Jen MowElizabeth Mumaw, DO 11/06/2015, 9:29 AM

## 2015-11-07 MED ORDER — SENNOSIDES-DOCUSATE SODIUM 8.6-50 MG PO TABS: 2.0000 | ORAL_TABLET | ORAL | 0 refills | Status: AC

## 2015-11-07 MED ORDER — IBUPROFEN 600 MG PO TABS: 600.0000 mg | ORAL_TABLET | Freq: Four times a day (QID) | ORAL | 0 refills | Status: AC

## 2015-11-07 NOTE — Discharge Summary (Signed)
OB Discharge Summary     Patient Name: Ellen Hall DOB: 22-Aug-1985 MRN: [id number]  Date of admission: 11/04/2015 Delivering MD: Jen Mow [facility]   Date of discharge: 11/07/2015  Admitting diagnosis: LABOR desires sterilization  Intrauterine pregnancy: 103w6d     Secondary diagnosis:  Active Problems:   Active labor at term   Encounter for sterilization  Additional problems: Limited prenatal care Antepartum chlamydia (TOC intrapartum was negative) Substance use (UDS 3showing cocaine, marijuana, nicotine)     Discharge diagnosis: Preterm Pregnancy Delivered                                                                                                Post partum procedures:postpartum tubal ligation  Augmentation: AROM and Pitocin  Complications: None  Hospital course:  Onset of Labor With Vaginal Delivery     30 y.o. yo [id number] at [redacted]w[redacted]d was admitted in Active Labor on 11/04/2015. Patient had an uncomplicated labor course as follows:  Membrane Rupture Time/Date: 5:35 PM ,11/04/2015   Intrapartum Procedures: Episiotomy: None [1]                                         Lacerations:  None [1]  Patient had a delivery of a Viable infant. 11/05/2015  Information for the patient's newborn:  Ellen, Hall [[id number]]  Delivery Method: Vaginal, Spontaneous Delivery (Filed from Delivery Summary)    Pateint had an uncomplicated postpartum course.  She is ambulating, tolerating a regular diet, passing flatus, and urinating well. Patient is discharged home in stable condition on 11/07/15.    Physical exam Vitals:   11/06/15 0200 11/06/15 0601 11/06/15 1801 11/07/15 0614  BP: 109/70 123/65 133/85 135/70  Pulse: (!) 52 (!) 59 61 63  Resp: 18 18 13 18   Temp: 98.3 F (36.8 C) 98.1 F (36.7 C) 97.5 F (36.4 C) 97.9 F (36.6 C)  TempSrc:  Oral Oral Oral  SpO2: 100%     Weight:      Height:       General: alert, cooperative and no distress Lochia:  appropriate Uterine Fundus: firm Incision: Healing well with no significant drainage, No significant erythema, Dressing is clean, dry, and intact DVT Evaluation: No evidence of DVT seen on physical exam. Negative Homan's sign. Labs: Lab Results  Component Value Date   WBC 18.4 (H) 11/05/2015   HGB 9.6 (L) 11/05/2015   HCT 28.5 (L) 11/05/2015   MCV 98.6 11/05/2015   PLT 135 (L) 11/05/2015   CMP Latest Ref Rng & Units 03/24/2010  Glucose 70 - 99 mg/dL 75  BUN 6 - 23 mg/dL 5(L)  Creatinine 0.4 - 1.2 mg/dL 1.30  Sodium 865 - 784 mEq/L 135  Potassium 3.5 - 5.1 mEq/L 3.7  Chloride 96 - 112 mEq/L 104  CO2 19 - 32 mEq/L 25  Calcium 8.4 - 10.5 mg/dL 8.7  Total Protein 6.0 - 8.3 g/dL 6.9(G)  Total Bilirubin 0.3 - 1.2 mg/dL 0.6  Alkaline Phos 39 - 117 U/L  252(H)  AST 0 - 37 U/L 20  ALT 0 - 35 U/L 12    Discharge instruction: per After Visit Summary and "Baby and Me Booklet".  After visit meds:    Medication List    TAKE these medications   acetaminophen 500 MG tablet Commonly known as:  TYLENOL Take 1,000 mg by mouth daily as needed for mild pain or headache.   ibuprofen 600 MG tablet Commonly known as:  ADVIL,MOTRIN Take 1 tablet (600 mg total) by mouth every 6 (six) hours.   prenatal multivitamin Tabs tablet Take 1 tablet by mouth daily at 12 noon.   senna-docusate 8.6-50 MG tablet Commonly known as:  Senokot-S Take 2 tablets by mouth daily.       Diet: routine diet  Activity: Advance as tolerated. Pelvic rest for 6 weeks.   Outpatient follow up:6 weeks Follow up Appt:No future appointments. Follow up Visit: Follow-up Information    [facility]. Schedule an appointment as soon as possible for a visit in 6 week(s).   Why:  postpartum visit Contact information: [address] [place] [address] [phone]        [facility] .   Why:  as needed for any potential emergencies Contact information: [address] [place] [phone]          Postpartum contraception: s/p Tubal Ligation  Newborn Data: Live born female  Birth Weight: 6 lb 13.2 oz (3096 g) APGAR: 8, 9  Baby Feeding: Bottle Disposition:home with mother pending SW consult today   11/07/2015 Leland HerElsia J Yoo, DO PGY-1  OB FELLOW DISCHARGE ATTESTATION  I have seen and examined this patient and agree with above documentation in the resident's note.   Ernestina PennaNicholas Onur Mori, MD 2:23 PM

## 2015-11-07 NOTE — Clinical Social Work Maternal (Signed)
  CLINICAL SOCIAL WORK MATERNAL/CHILD NOTE  Patient Details  Name: Ellen Hall MRN: [id number] Date of Birth: Feb 25, 1986  Date:  11/07/2015  Clinical Social Worker Initiating Note:  Laurey Arrow Date/ Time Initiated:  11/07/15/0900     Child's Name:  Ellen Hall   Legal Guardian:  Mother   Need for Interpreter:  None   Date of Referral:  11/05/15     Reason for Referral:  Current Substance Use/Substance Use During Pregnancy    Referral Source:  Central Nursery   Address:  [address] [place] [address]  Phone number:  [phone]   Household Members:  Self, Minor Children   Natural Supports (not living in the home):  Immediate Family, Parent, Spouse/significant other   Professional Supports: None   Employment: Unemployed   Type of Work:     Education:  9 to 11 years   Museum/gallery curator Resources:  Medicaid   Other Resources:  Physicist, medical , [place] Considerations Which May Impact Care:  Per [employer], MOB is Engineer, manufacturing  Strengths:  Ability to meet basic needs , Home prepared for child , Pediatrician chosen    Risk Factors/Current Problems:  Substance Use    Cognitive State:  Insightful    Mood/Affect:  Calm , Interested , Comfortable    CSW Assessment: CSW met with MOB to complete an assessment for a consult for hx of substance in pregnancy.  MOB was polite, inviting, and interested in meeting with CSW. CSW inquired about MOB's substance use and MOB acknowledge the use of marijuana throughout MOB's pregnancy.  MO reported MOB's last use of marijuana was on 11/02/15. MOB denied the use of any other substance.  CSW made MOB aware that MOB had a positive screen for [facility] and cocaine upon admissions.  MOB again acknowledged marijuana but denied any cocaine usage. CSW explained to MOB the hospital's policy and procedure relating to hx of substance. CSW informed MOB that the infant had a positive UDS for THC.  CSW informed  MOb that CSW will make a report to [place] office. CSW also informed MOB that CSW will monitor the infant's cord will update CPS if any additional substance are positive. MOB was understanding.  MOB acknowledged hx of CPS involvement and communicated that at this time MOB did not have an open case. MOB was vague with explaining to CSW hx of CPS involvement.  CSW offered MOB resources and interventions for SA treatment and MOB declined.  However, MOB to take brochure for the Healthy Start program and was encouraged to sign up for the program. MOB denied hx of  PPD. CSW educated MOB about PPD, and encouraged MOB to seek medical attention if she demonstrates increased signs and symptoms of PPD. CSW reviewed safe sleep, and SIDS. MOB was knowledgeable and asked appropriate questions.  MOB communicated that she has a bassinet for the baby, and feels prepared for the infant.  MOB did not have any further questions, concerns, or needs at this time.  CSW made report to [place] assessment worker, Wendall Stade.   CSW Plan/Description:  Engineer, mining , Child Protective Service Report , No Further Intervention Required/No Barriers to Discharge, Information/Referral to [place], MSW, [employer] Social Work [phone]   Dimple Nanas, LCSW 11/07/2015, 10:55 AM

## 2015-11-07 NOTE — Discharge Instructions (Signed)

## 2015-11-12 ENCOUNTER — Encounter (HOSPITAL_COMMUNITY): Payer: Self-pay | Admitting: Family Medicine

## 2016-01-03 ENCOUNTER — Encounter: Payer: Self-pay | Admitting: *Deleted

## 2019-04-26 ENCOUNTER — Other Ambulatory Visit: Payer: Self-pay

## 2019-04-26 ENCOUNTER — Emergency Department (HOSPITAL_COMMUNITY)
Admission: EM | Admit: 2019-04-26 | Discharge: 2019-04-26 | Disposition: A | Discharge status: Home / Self Care | Payer: Medicaid Other | Attending: Emergency Medicine | Admitting: Emergency Medicine

## 2019-04-26 ENCOUNTER — Emergency Department (HOSPITAL_COMMUNITY): Payer: Medicaid Other

## 2019-04-26 ENCOUNTER — Encounter (HOSPITAL_COMMUNITY): Payer: Self-pay | Admitting: Emergency Medicine

## 2019-04-26 DIAGNOSIS — Y999 Unspecified external cause status: Secondary | ICD-10-CM | POA: Insufficient documentation

## 2019-04-26 DIAGNOSIS — S46812A Strain of other muscles, fascia and tendons at shoulder and upper arm level, left arm, initial encounter: Secondary | ICD-10-CM | POA: Diagnosis not present

## 2019-04-26 DIAGNOSIS — Y929 Unspecified place or not applicable: Secondary | ICD-10-CM | POA: Insufficient documentation

## 2019-04-26 DIAGNOSIS — R079 Chest pain, unspecified: Secondary | ICD-10-CM | POA: Diagnosis not present

## 2019-04-26 DIAGNOSIS — X58XXXA Exposure to other specified factors, initial encounter: Secondary | ICD-10-CM | POA: Insufficient documentation

## 2019-04-26 DIAGNOSIS — Z87891 Personal history of nicotine dependence: Secondary | ICD-10-CM | POA: Insufficient documentation

## 2019-04-26 DIAGNOSIS — Y939 Activity, unspecified: Secondary | ICD-10-CM | POA: Insufficient documentation

## 2019-04-26 DIAGNOSIS — S4992XA Unspecified injury of left shoulder and upper arm, initial encounter: Secondary | ICD-10-CM | POA: Diagnosis present

## 2019-04-26 LAB — I-STAT BETA HCG BLOOD, ED (MC, WL, AP ONLY): I-stat hCG, quantitative: <5 m[IU]/mL (ref <5)

## 2019-04-26 LAB — PROTIME-INR
INR: 0.9 (ref 0.8–1.2)
Prothrombin Time: 12.3 seconds (ref 11.4–15.2)

## 2019-04-26 LAB — CBC
HCT: 38.1 % (ref 36.0–46.0)
Hemoglobin: 12.4 g/dL (ref 12.0–15.0)
MCH: 33.2 pg (ref 26.0–34.0)
MCHC: 32.5 g/dL (ref 30.0–36.0)
MCV: 101.9 fL — ABNORMAL HIGH (ref 80.0–100.0)
Platelets: 199 10*3/uL (ref 150–400)
RBC: 3.74 MIL/uL — ABNORMAL LOW (ref 3.87–5.11)
RDW: 12.4 % (ref 11.5–15.5)
WBC: 8.6 10*3/uL (ref 4.0–10.5)
nRBC: 0 % (ref 0.0–0.2)

## 2019-04-26 LAB — TROPONIN I (HIGH SENSITIVITY)
Troponin I (High Sensitivity): <2 ng/L (ref <18)
Troponin I (High Sensitivity): <2 ng/L (ref <18)

## 2019-04-26 LAB — BASIC METABOLIC PANEL
Anion gap: 9 (ref 5–15)
BUN: 14 mg/dL (ref 6–20)
CO2: 25 mmol/L (ref 22–32)
Calcium: 9.1 mg/dL (ref 8.9–10.3)
Chloride: 106 mmol/L (ref 98–111)
Creatinine, Ser: 1.14 mg/dL — ABNORMAL HIGH (ref 0.44–1.00)
GFR calc Af Amer: >60 mL/min (ref >60)
GFR calc non Af Amer: >60 mL/min (ref >60)
Glucose, Bld: 121 mg/dL — ABNORMAL HIGH (ref 70–99)
Potassium: 4.3 mmol/L (ref 3.5–5.1)
Sodium: 140 mmol/L (ref 135–145)

## 2019-04-26 MED ORDER — SODIUM CHLORIDE 0.9% FLUSH: 3.0000 mL | Freq: Once | INTRAVENOUS | Status: DC

## 2019-04-26 NOTE — ED Triage Notes (Signed)
Patient reports left chest tightness this evening with mild SOB , no emesis or diaphoresis , patient added left side body aches / numbness today . Denies cough or fever.

## 2019-04-26 NOTE — Discharge Instructions (Signed)
Smoking is bad for you you should try and stop.  If you need help stopping there his a phone number on this paperwork that you can use.  There is someone there that can provide you with support be of medicines for therapy.  Take 4 over the counter ibuprofen tablets 3 times a day or 2 over-the-counter naproxen tablets twice a day for pain. Also take tylenol 1000mg (2 extra strength) four times a day.

## 2019-04-26 NOTE — ED Provider Notes (Signed)
[facility] EMERGENCY DEPARTMENT Provider Note   CSN: [id number] Arrival date & time: 04/26/19  0104     History Chief Complaint  Patient presents with  . Chest Tightness    Ellen Hall is a 34 y.o. female.  34 yo F with a cc of chest pain.  This is left-sided and described as heavy.  Nothing seems to make it better or worse.  Seems to come and go.  She has been coughing a little bit over the past few days.  No fevers no vomiting no diarrhea.  No abdominal tenderness.  Denies history of PE or DVT denies hemoptysis denies unilateral lower extremity edema denies recent surgery immobilization or estrogen use.  Denies history of cancer.  Denies history of hypertension hyperlipidemia diabetes or family history of MI.  She is a current everyday smoker.  The history is provided by the patient.  Chest Pain Pain location:  L chest Pain quality: dull   Pain radiates to:  Does not radiate Pain severity:  Moderate Onset quality:  Gradual Duration:  2 days Timing:  Constant Progression:  Worsening Chronicity:  New Relieved by:  Nothing Worsened by:  Nothing Ineffective treatments:  None tried Associated symptoms: no dizziness, no fever, no headache, no nausea, no palpitations, no shortness of breath and no vomiting        Past Medical History:  Diagnosis Date  . BV (bacterial vaginosis)   . Late prenatal care     Patient Active Problem List   Diagnosis Date Noted  . Encounter for sterilization   . Active labor at term 11/04/2015  . No prenatal care in current pregnancy in third trimester   . Encounter for fetal anatomic survey   . [redacted] weeks gestation of pregnancy   . Active labor 06/15/2014  . Abnormal O'Sullivan glucose challenge test, antepartum 10/25/2012  . Post term pregnancy, antepartum 10/25/2012  . Need for rubella vaccination 09/12/2012  . Insufficient prenatal care in third trimester 08/16/2012    Past Surgical History:  Procedure  Laterality Date  . NO PAST SURGERIES    . TUBAL LIGATION Bilateral 11/05/2015   Procedure: POST PARTUM TUBAL LIGATION;  Surgeon: Reva Bores, MD;  Location: [facility];  Service: Gynecology;  Laterality: Bilateral;     OB History    Gravida  6   Para  6   Term  4   Preterm  2   AB      Living  6     SAB      TAB      Ectopic      Multiple  0   Live Births  6           Family History  Problem Relation Age of Onset  . Diabetes Mother   . Diabetes Sister   . Diabetes Maternal Grandmother     Social History   Tobacco Use  . Smoking status: Former Games developer  . Smokeless tobacco: Never Used  Substance Use Topics  . Alcohol use: Yes    Comment: Once during pregnancy  . Drug use: Yes    Types: Marijuana    Comment: 1 month ago    Home Medications Prior to Admission medications   Medication Sig Start Date End Date Taking? Authorizing Provider  acetaminophen (TYLENOL) 500 MG tablet Take 1,000 mg by mouth daily as needed for mild pain or headache.    [provider]  ibuprofen (ADVIL,MOTRIN) 600 MG tablet Take 1  tablet (600 mg total) by mouth every 6 (six) hours. 11/07/15   Bufford Lope, DO  Prenatal Vit-Fe Fumarate-FA (PRENATAL MULTIVITAMIN) TABS tablet Take 1 tablet by mouth daily at 12 noon.    [provider]  senna-docusate (SENOKOT-S) 8.6-50 MG tablet Take 2 tablets by mouth daily. 11/07/15   Bufford Lope, DO    Allergies    Patient has no known allergies.  Review of Systems   Review of Systems  Constitutional: Negative for chills and fever.  HENT: Negative for congestion and rhinorrhea.   Eyes: Negative for redness and visual disturbance.  Respiratory: Negative for shortness of breath and wheezing.   Cardiovascular: Positive for chest pain. Negative for palpitations.  Gastrointestinal: Negative for nausea and vomiting.  Genitourinary: Negative for dysuria and urgency.  Musculoskeletal: Negative for arthralgias and myalgias.  Skin:  Negative for pallor and wound.  Neurological: Negative for dizziness and headaches.    Physical Exam Updated Vital Signs BP (!) 159/85 (BP Location: Left Arm)   Pulse 80   Temp 99.4 F (37.4 C) (Oral)   Resp 18   LMP 04/06/2019   SpO2 100%   Physical Exam Vitals and nursing note reviewed.  Constitutional:      General: She is not in acute distress.    Appearance: She is well-developed. She is not diaphoretic.  HENT:     Head: Normocephalic and atraumatic.  Eyes:     Pupils: Pupils are equal, round, and reactive to light.  Cardiovascular:     Rate and Rhythm: Normal rate and regular rhythm.     Heart sounds: No murmur. No friction rub. No gallop.   Pulmonary:     Effort: Pulmonary effort is normal.     Breath sounds: No wheezing or rales.  Abdominal:     General: There is no distension.     Palpations: Abdomen is soft.     Tenderness: There is no abdominal tenderness.  Musculoskeletal:        General: Tenderness present.     Cervical back: Normal range of motion and neck supple.     Comments: Some tenderness to the left trapezius.  Pulse motor and sensation is intact to the left upper extremity.  Skin:    General: Skin is warm and dry.  Neurological:     Mental Status: She is alert and oriented to person, place, and time.  Psychiatric:        Behavior: Behavior normal.     ED Results / Procedures / Treatments   Labs (all labs ordered are listed, but only abnormal results are displayed) Labs Reviewed  BASIC METABOLIC PANEL - Abnormal; Notable for the following components:      Result Value   Glucose, Bld 121 (*)    Creatinine, Ser 1.14 (*)    All other components within normal limits  CBC - Abnormal; Notable for the following components:   RBC 3.74 (*)    MCV 101.9 (*)    All other components within normal limits  PROTIME-INR  I-STAT BETA HCG BLOOD, ED ([facility], [facility], [facility] ONLY)  TROPONIN I (HIGH SENSITIVITY)  TROPONIN I (HIGH SENSITIVITY)    EKG EKG  Interpretation  Date/Time:  Wednesday April 26 2019 01:18:04 EST Ventricular Rate:  70 PR Interval:  130 QRS Duration: 76 QT Interval:  368 QTC Calculation: 397 R Axis:   39 Text Interpretation: Normal sinus rhythm Normal ECG No old tracing to compare Confirmed by Deno Etienne [phone]) on 04/26/2019 4:31:39 AM  Radiology DG Chest 2 View  Result Date: 04/26/2019 CLINICAL DATA:  Chest tightness EXAM: CHEST - 2 VIEW COMPARISON:  None. FINDINGS: The heart size and mediastinal contours are within normal limits. Both lungs are clear. The visualized skeletal structures are unremarkable. IMPRESSION: No active cardiopulmonary disease. Electronically Signed   By: Jonna Clark M.D.   On: 04/26/2019 02:00    Procedures Procedures (including critical care time) Discussed smoking cessation with patient and was they were offerred resources to help stop.  Total time was 5 min CPT code 86578.   Medications Ordered in ED Medications  sodium chloride flush (NS) 0.9 % injection 3 mL (has no administration in time range)    ED Course  I have reviewed the triage vital signs and the nursing notes.  Pertinent labs & imaging results that were available during my care of the patient were reviewed by me and considered in my medical decision making (see chart for details).    MDM Rules/Calculators/A&P                      34 yo F with a chief complaint chest pain.  This is atypical in nature and reproduced on exam.  Most likely this is muscular.  Have her take Tylenol and ibuprofen at home.  She had a delta troponin performed here that is negative.  She is PERC negative.  5:26 AM:  I have discussed the diagnosis/risks/treatment options with the patient and believe the pt to be eligible for discharge home to follow-up with PCP. We also discussed returning to the ED immediately if new or worsening sx occur. We discussed the sx which are most concerning (e.g., sudden worsening pain, fever, inability to  tolerate by mouth) that necessitate immediate return. Medications administered to the patient during their visit and any new prescriptions provided to the patient are listed below.  Medications given during this visit Medications  sodium chloride flush (NS) 0.9 % injection 3 mL (has no administration in time range)     The patient appears reasonably screen and/or stabilized for discharge and I doubt any other medical condition or other [facility] requiring further screening, evaluation, or treatment in the ED at this time prior to discharge.   Final Clinical Impression(s) / ED Diagnoses Final diagnoses:  Trapezius strain, left, initial encounter    Rx / DC Orders ED Discharge Orders    None       Melene Plan, DO 04/26/19 [id number]

## 2019-10-11 DIAGNOSIS — Z5181 Encounter for therapeutic drug level monitoring: Secondary | ICD-10-CM | POA: Diagnosis not present

## 2019-10-12 DIAGNOSIS — Z5181 Encounter for therapeutic drug level monitoring: Secondary | ICD-10-CM | POA: Diagnosis not present

## 2020-12-02 ENCOUNTER — Other Ambulatory Visit: Payer: Self-pay

## 2020-12-02 ENCOUNTER — Encounter (HOSPITAL_COMMUNITY): Payer: Self-pay | Admitting: Emergency Medicine

## 2020-12-02 ENCOUNTER — Ambulatory Visit (HOSPITAL_COMMUNITY)
Admission: EM | Admit: 2020-12-02 | Discharge: 2020-12-02 | Disposition: A | Discharge status: Home / Self Care | Payer: Medicaid Other | Attending: Emergency Medicine | Admitting: Emergency Medicine

## 2020-12-02 DIAGNOSIS — M654 Radial styloid tenosynovitis [de Quervain]: Secondary | ICD-10-CM

## 2020-12-02 MED ORDER — PREDNISONE 20 MG PO TABS: 40.0000 mg | ORAL_TABLET | Freq: Every day | ORAL | 0 refills | Status: AC

## 2020-12-02 MED ORDER — MELOXICAM 7.5 MG PO TABS
7.5000 mg | ORAL_TABLET | Freq: Every day | ORAL | 0 refills | Status: AC
Start: 2020-12-02 — End: ?

## 2020-12-02 NOTE — ED Provider Notes (Signed)
[facility]    CSN: [id number] Arrival date & time: 12/02/20  1308      History   Chief Complaint Chief Complaint  Patient presents with   Hand Pain    HPI SHALITA NOTTE is a 35 y.o. female.   Patient presents with numbness and tingling of the left third and fourth fingers beginning 1 week ago, endorses that she woke up this morning with swelling of her entire left hand.  Limited range of motion, unable to close hand to fist.  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  Symptoms have not occurred before.  Has not attempted treatment.  Past Medical History:  Diagnosis Date   BV (bacterial vaginosis)    Late prenatal care     Patient Active Problem List   Diagnosis Date Noted   Encounter for sterilization    Active labor at term 11/04/2015   No prenatal care in current pregnancy in third trimester    Encounter for fetal anatomic survey    [redacted] weeks gestation of pregnancy    Active labor 06/15/2014   Abnormal O'Sullivan glucose challenge test, antepartum 10/25/2012   Post term pregnancy, antepartum 10/25/2012   Need for rubella vaccination 09/12/2012   Insufficient prenatal care in third trimester 08/16/2012    Past Surgical History:  Procedure Laterality Date   NO PAST SURGERIES     TUBAL LIGATION Bilateral 11/05/2015   Procedure: POST PARTUM TUBAL LIGATION;  Surgeon: Reva Bores, MD;  Location: [facility];  Service: Gynecology;  Laterality: Bilateral;    OB History     Gravida  6   Para  6   Term  4   Preterm  2   AB      Living  6      SAB      IAB      Ectopic      Multiple  0   Live Births  6            Home Medications    Prior to Admission medications   Medication Sig Start Date End Date Taking? Authorizing Provider  meloxicam (MOBIC) 7.5 MG tablet Take 1 tablet (7.5 mg total) by mouth daily. 12/02/20  Yes Nevayah Faust R, NP  predniSONE  (DELTASONE) 20 MG tablet Take 2 tablets (40 mg total) by mouth daily. 12/02/20  Yes Evadene Wardrip, Elita Boone, NP  acetaminophen (TYLENOL) 500 MG tablet Take 1,000 mg by mouth daily as needed for mild pain or headache.    [provider]  ibuprofen (ADVIL,MOTRIN) 600 MG tablet Take 1 tablet (600 mg total) by mouth every 6 (six) hours. 11/07/15   Leland Her, DO  Prenatal Vit-Fe Fumarate-FA (PRENATAL MULTIVITAMIN) TABS tablet Take 1 tablet by mouth daily at 12 noon.    [provider]  senna-docusate (SENOKOT-S) 8.6-50 MG tablet Take 2 tablets by mouth daily. 11/07/15   Leland Her, DO    Family History Family History  Problem Relation Age of Onset   Diabetes Mother    Diabetes Sister    Diabetes Maternal Grandmother     Social History Social History   Tobacco Use   Smoking status: Former   Smokeless tobacco: Never  Substance Use Topics   Alcohol use: Yes    Comment: Once during pregnancy   Drug use: Yes    Types: Marijuana    Comment: 1 month ago  Allergies   Patient has no known allergies.   Review of Systems Review of Systems  Constitutional: Negative.   Respiratory: Negative.    Cardiovascular: Negative.   Musculoskeletal:  Positive for joint swelling. Negative for arthralgias, back pain, gait problem, myalgias, neck pain and neck stiffness.  Skin: Negative.   Neurological:  Positive for numbness. Negative for dizziness, tremors, seizures, syncope, facial asymmetry, speech difficulty, weakness, light-headedness and headaches.    Physical Exam Triage Vital Signs ED Triage Vitals  Enc Vitals Group     BP 12/02/20 1508 139/80     Pulse Rate 12/02/20 1508 70     Resp 12/02/20 1508 18     Temp 12/02/20 1508 98.4 F (36.9 C)     Temp Source 12/02/20 1508 Oral     SpO2 12/02/20 1508 95 %     Weight --      Height --      Head Circumference --      Peak Flow --      Pain Score 12/02/20 1506 8     Pain Loc --      Pain Edu? --      Excl. in GC? --     No data found.  Updated Vital Signs BP 139/80 (BP Location: Left Arm)   Pulse 70   Temp 98.4 F (36.9 C) (Oral)   Resp 18   LMP 11/18/2020   SpO2 95%   Visual Acuity Right Eye Distance:   Left Eye Distance:   Bilateral Distance:    Right Eye Near:   Left Eye Near:    Bilateral Near:     Physical Exam Constitutional:      Appearance: Normal appearance. She is normal weight.  HENT:     Head: Normocephalic.  Eyes:     Extraocular Movements: Extraocular movements intact.  Pulmonary:     Effort: Pulmonary effort is normal.  Musculoskeletal:     Comments: Diffuse swelling throughout the entire left hand and wrist, point tenderness at the base of the left thumb, positive Finkelstein sign, decreased sensation of all 5 fingers, no deformity noted, limited range of motion, difficulty flexing fingers, 2+ radial pulse  Neurological:     Mental Status: She is alert and oriented to person, place, and time. Mental status is at baseline.  Psychiatric:        Mood and Affect: Mood normal.        Behavior: Behavior normal.     UC Treatments / Results  Labs (all labs ordered are listed, but only abnormal results are displayed) Labs Reviewed - No data to display  EKG   Radiology No results found.  Procedures Procedures (including critical care time)  Medications Ordered in UC Medications - No data to display  Initial Impression / Assessment and Plan / UC Course  I have reviewed the triage vital signs and the nursing notes.  Pertinent labs & imaging results that were available during my care of the patient were reviewed by me and considered in my medical decision making (see chart for details).  De Quervain's tendinitis  1.  Prednisone 40 mg daily for 5 days next. 2.  Meloxicam 7.5 mg daily for 5 days after completion of steroids then as needed 3.  Recommended purchasing wrist brace from local pharmacy for additional comfort 4.  Heat or ice for 15 intervals for  additional comfort 5.  Recommended follow-up for reoccurring or persistent symptoms with urgent care or orthopedic specialist Final Clinical Impressions(s) /  UC Diagnoses   Final diagnoses:  Tendinitis, de Quervain's     Discharge Instructions      Starting tomorrow morning take prednisone daily with food for the next 5 days  After completion of prednisone may use meloxicam as needed, I suggest taking a dose prior to work to prevent further irritation  You may purchase a wrist brace at [facility] for support  You may apply heat or ice, whichever makes you feel better, to affected area in 15 minute intervals  You may continue activity as tolerated, there is no injury therefore, it is important that you continue to move around so you do not loose strength to the area  If symptoms persist past 2 weeks, you may follow up at urgent care or with orthopedic specialist for evaluation, an orthopedic doctor specializes in the bone, they may provide  management such as but not limited to imaging, long term medications and physical therapy    ED Prescriptions     Medication Sig Dispense Auth. Provider   predniSONE (DELTASONE) 20 MG tablet Take 2 tablets (40 mg total) by mouth daily. 10 tablet Harutyun Monteverde, Hansel Starling R, NP   meloxicam (MOBIC) 7.5 MG tablet Take 1 tablet (7.5 mg total) by mouth daily. 30 tablet Valinda Hoar, NP      PDMP not reviewed this encounter.   Valinda Hoar, NP 12/02/20 1644

## 2020-12-02 NOTE — ED Triage Notes (Signed)
Pt c/o left 3rd and 4th fingers started becoming numb and tingling over week ago. Reports woke up today with left hand swelling. 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

## 2020-12-02 NOTE — Discharge Instructions (Addendum)
Starting tomorrow morning take prednisone daily with food for the next 5 days  After completion of prednisone may use meloxicam as needed, I suggest taking a dose prior to work to prevent further irritation  You may purchase a wrist brace at [facility] for support  You may apply heat or ice, whichever makes you feel better, to affected area in 15 minute intervals  You may continue activity as tolerated, there is no injury therefore, it is important that you continue to move around so you do not loose strength to the area  If symptoms persist past 2 weeks, you may follow up at urgent care or with orthopedic specialist for evaluation, an orthopedic doctor specializes in the bone, they may provide  management such as but not limited to imaging, long term medications and physical therapy

## 2021-10-29 IMAGING — CR DG CHEST 2V
2 series · 2 of 2 positions shown · non-contrast
Comparison: None.

CLINICAL DATA: Chest tightness

EXAM:
CHEST - 2 VIEW

[chest pa]
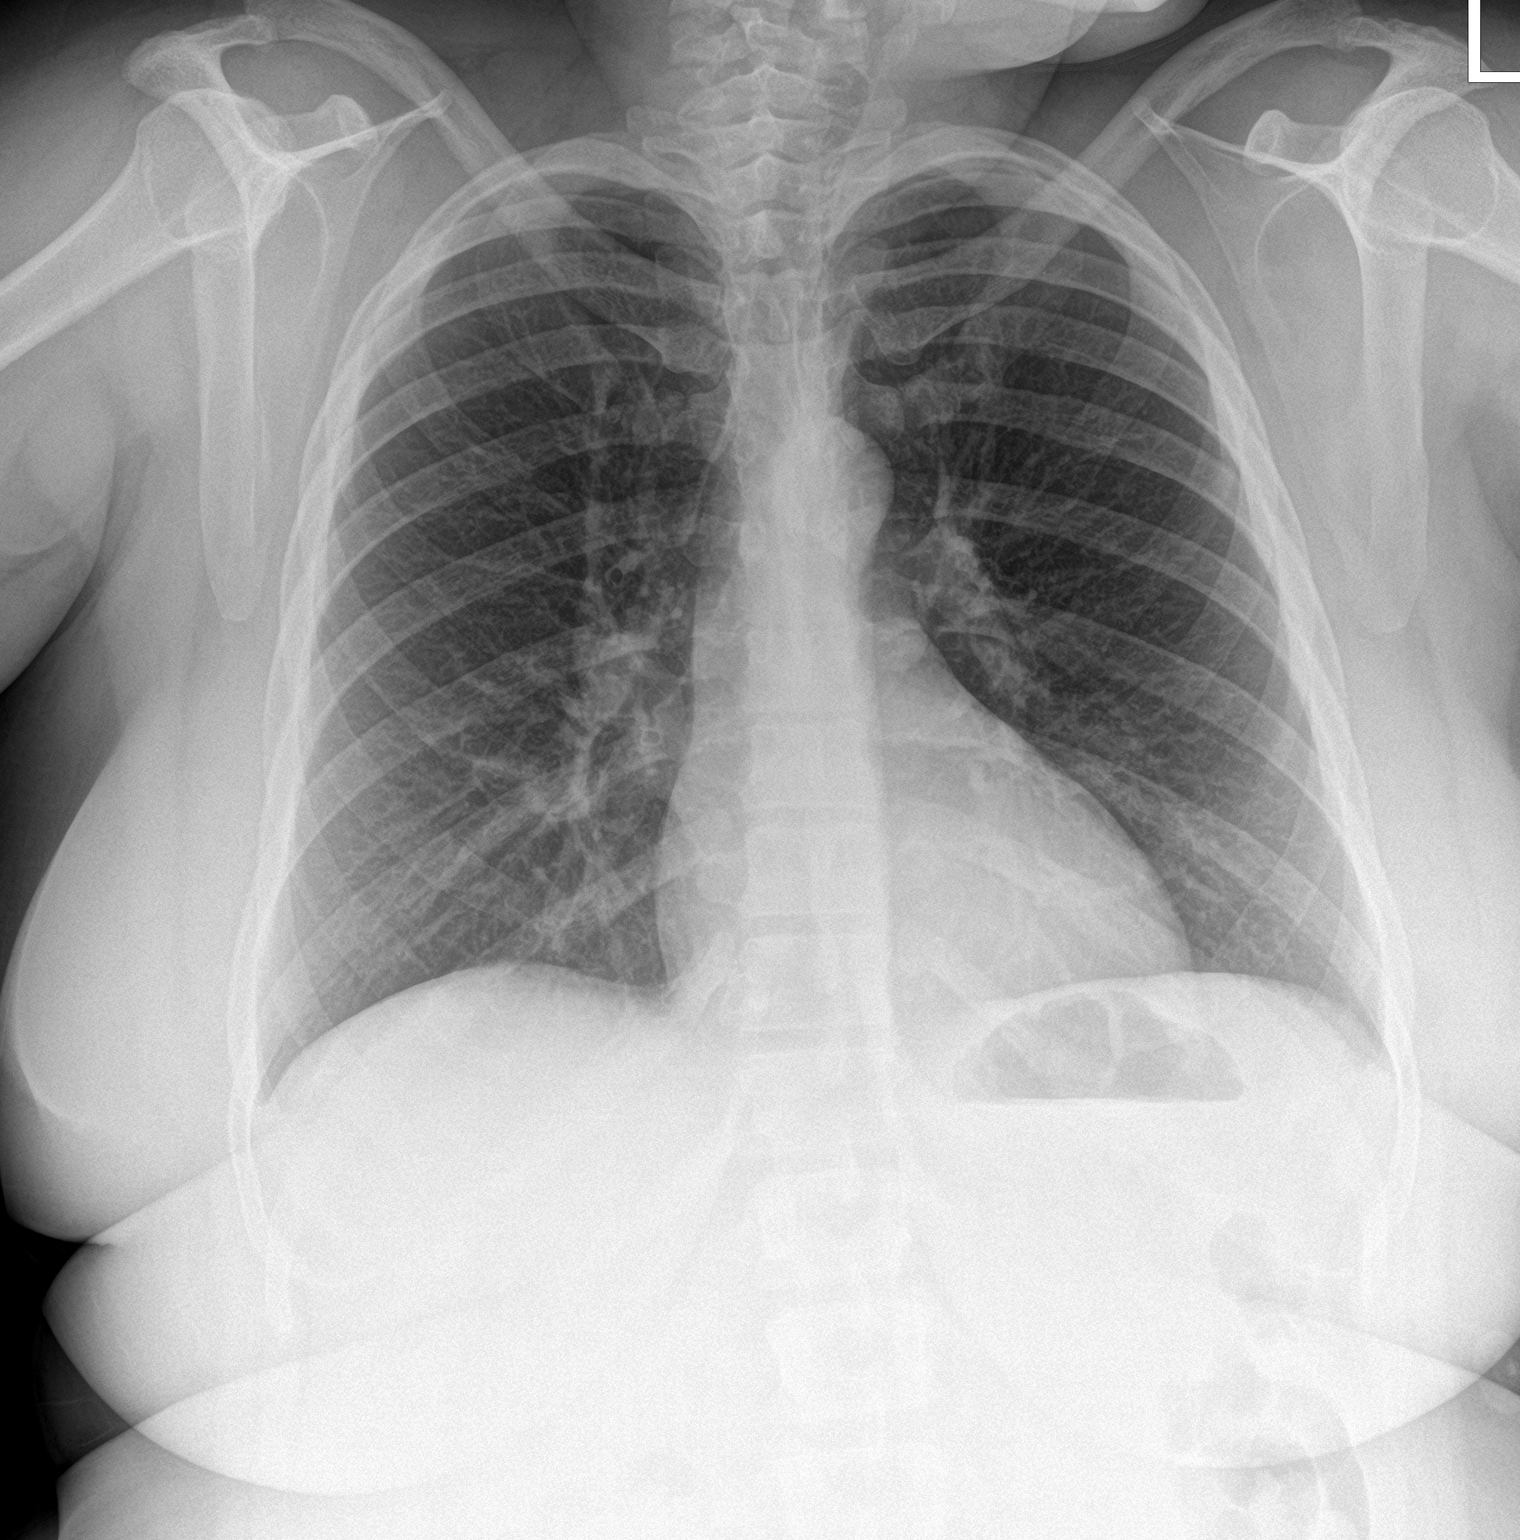

[chest lat]
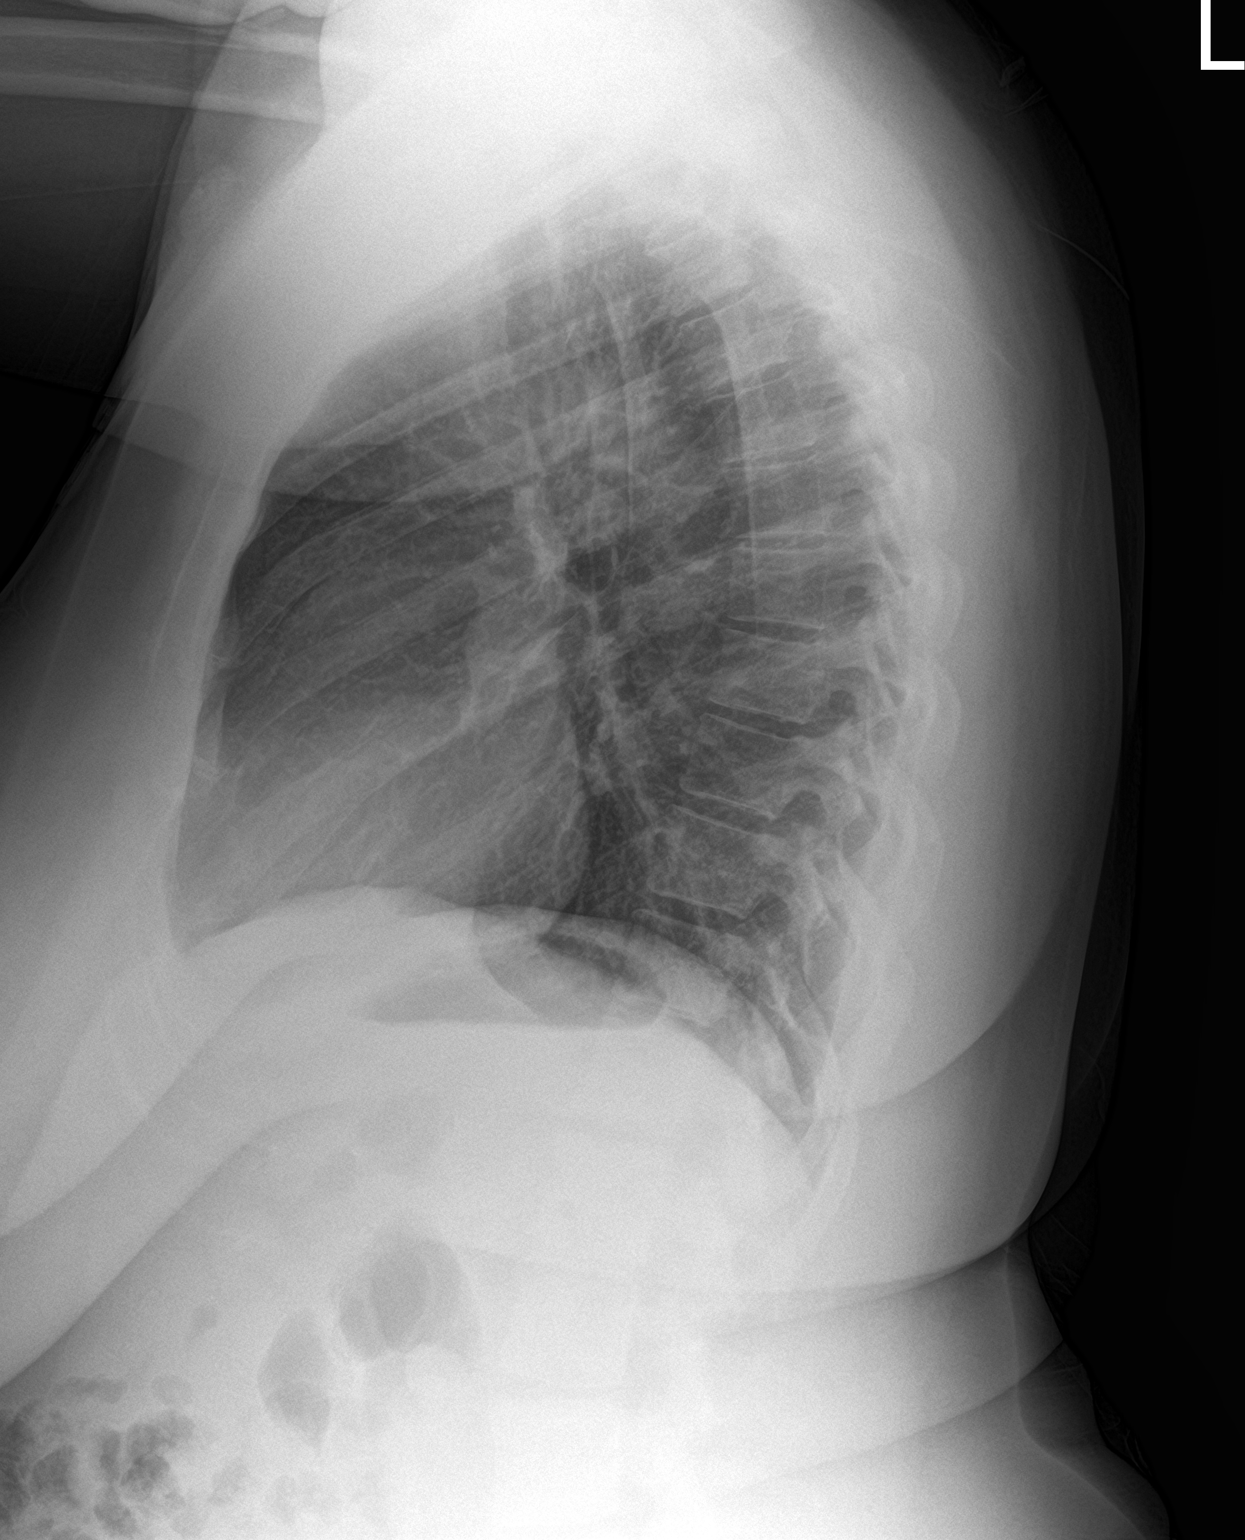

[2 of 2 positions shown; findings below may reference images not displayed]

FINDINGS: The heart size and mediastinal contours are within normal limits.
Both lungs are clear. The visualized skeletal structures are
unremarkable.
IMPRESSION: No active cardiopulmonary disease.

## 2022-01-15 DIAGNOSIS — F320 Major depressive disorder, single episode, mild: Secondary | ICD-10-CM | POA: Diagnosis not present

## 2022-04-22 ENCOUNTER — Encounter (HOSPITAL_COMMUNITY): Payer: Self-pay

## 2022-04-22 ENCOUNTER — Other Ambulatory Visit: Payer: Self-pay

## 2022-04-22 ENCOUNTER — Emergency Department (HOSPITAL_COMMUNITY)
Admission: EM | Admit: 2022-04-22 | Discharge: 2022-04-22 | Disposition: A | Discharge status: Home / Self Care | Payer: Medicaid Other | Attending: Emergency Medicine | Admitting: Emergency Medicine

## 2022-04-22 DIAGNOSIS — R5383 Other fatigue: Secondary | ICD-10-CM | POA: Insufficient documentation

## 2022-04-22 DIAGNOSIS — F1110 Opioid abuse, uncomplicated: Secondary | ICD-10-CM | POA: Diagnosis not present

## 2022-04-22 LAB — COMPREHENSIVE METABOLIC PANEL
ALT: 47 U/L — ABNORMAL HIGH (ref 0–44)
AST: 61 U/L — ABNORMAL HIGH (ref 15–41)
Albumin: 3.3 g/dL — ABNORMAL LOW (ref 3.5–5.0)
Alkaline Phosphatase: 43 U/L (ref 38–126)
Anion gap: 10 (ref 5–15)
BUN: 17 mg/dL (ref 6–20)
CO2: 24 mmol/L (ref 22–32)
Calcium: 8.6 mg/dL — ABNORMAL LOW (ref 8.9–10.3)
Chloride: 107 mmol/L (ref 98–111)
Creatinine, Ser: 1.24 mg/dL — ABNORMAL HIGH (ref 0.44–1.00)
GFR, Estimated: 59 mL/min — ABNORMAL LOW (ref >60)
Glucose, Bld: 118 mg/dL — ABNORMAL HIGH (ref 70–99)
Potassium: 4.3 mmol/L (ref 3.5–5.1)
Sodium: 141 mmol/L (ref 135–145)
Total Bilirubin: 0.5 mg/dL (ref 0.3–1.2)
Total Protein: 6.1 g/dL — ABNORMAL LOW (ref 6.5–8.1)

## 2022-04-22 LAB — CBC WITH DIFFERENTIAL/PLATELET
Abs Immature Granulocytes: 0.09 10*3/uL — ABNORMAL HIGH (ref 0.00–0.07)
Basophils Absolute: 0 10*3/uL (ref 0.0–0.1)
Basophils Relative: 0 %
Eosinophils Absolute: 0 10*3/uL (ref 0.0–0.5)
Eosinophils Relative: 0 %
HCT: 39.1 % (ref 36.0–46.0)
Hemoglobin: 13.1 g/dL (ref 12.0–15.0)
Immature Granulocytes: 1 %
Lymphocytes Relative: 8 %
Lymphs Abs: 1.1 10*3/uL (ref 0.7–4.0)
MCH: 34.4 pg — ABNORMAL HIGH (ref 26.0–34.0)
MCHC: 33.5 g/dL (ref 30.0–36.0)
MCV: 102.6 fL — ABNORMAL HIGH (ref 80.0–100.0)
Monocytes Absolute: 0.7 10*3/uL (ref 0.1–1.0)
Monocytes Relative: 5 %
Neutro Abs: 12.1 10*3/uL — ABNORMAL HIGH (ref 1.7–7.7)
Neutrophils Relative %: 86 %
Platelets: 220 10*3/uL (ref 150–400)
RBC: 3.81 MIL/uL — ABNORMAL LOW (ref 3.87–5.11)
RDW: 12.7 % (ref 11.5–15.5)
WBC: 14 10*3/uL — ABNORMAL HIGH (ref 4.0–10.5)
nRBC: 0 % (ref 0.0–0.2)

## 2022-04-22 LAB — I-STAT BETA HCG BLOOD, ED (MC, WL, AP ONLY): I-stat hCG, quantitative: <5 m[IU]/mL (ref <5)

## 2022-04-22 NOTE — ED Notes (Signed)
Per provider okay to drink water. Water provided to patient.

## 2022-04-22 NOTE — ED Provider Notes (Signed)
[facility] Provider Note   CSN: [id number] Arrival date & time: 04/22/22  [id number]     History  Chief Complaint  Patient presents with   Fatigue    RAYHANNA LOLLEY is a 37 y.o. female with medical history of BV.  Patient presents to ED with mother for evaluation.  Per patient mother, yesterday she had a hard time reaching the patient.  Patient mother reports that around 5 PM she called the patient's cell phone however an unknown woman answered and stated that she could not get her daughter to wake up.  Patient mother then went to find the patient at this woman's house passed out on the couch.  The patient mother reports that she was unable to arouse the patient however patient was still breathing.  Patient mother reports at this time she left the patient there and went home.  Patient mother reports that she called the patient back this morning and the patient was still at this woman's house.  Patient mother reports that she then went and picked the patient up and brought her here for evaluation because the patient was very drowsy, could not walk.  Patient mother reports at this time she believes maybe her legs were swollen and this is why she told registration this.  On exam, the patient is alert and oriented x 4.  Patient admits to snorting unknown white powder yesterday that was provided to her by friend.  Patient denies any other drug use.  Patient denies medical complaints.  Patient alert and oriented.  Denies fevers, chest pain, shortness of breath, abdominal pain, nausea, vomiting, dysuria, one-sided weakness or numbness, headache, dizziness.  HPI     Home Medications Prior to Admission medications   Medication Sig Start Date End Date Taking? Authorizing Provider  acetaminophen (TYLENOL) 500 MG tablet Take 1,000 mg by mouth daily as needed for mild pain or headache.    [provider]  ibuprofen (ADVIL,MOTRIN) 600 MG tablet Take 1  tablet (600 mg total) by mouth every 6 (six) hours. 11/07/15   Bufford Lope, DO  meloxicam (MOBIC) 7.5 MG tablet Take 1 tablet (7.5 mg total) by mouth daily. 12/02/20   White, Leitha Schuller, NP  predniSONE (DELTASONE) 20 MG tablet Take 2 tablets (40 mg total) by mouth daily. 12/02/20   Hans Eden, NP  Prenatal Vit-Fe Fumarate-FA (PRENATAL MULTIVITAMIN) TABS tablet Take 1 tablet by mouth daily at 12 noon.    [provider]  senna-docusate (SENOKOT-S) 8.6-50 MG tablet Take 2 tablets by mouth daily. 11/07/15   Bufford Lope, DO      Allergies    Patient has no known allergies.    Review of Systems   Review of Systems  All other systems reviewed and are negative.   Physical Exam Updated Vital Signs BP (!) 136/96 (BP Location: Right Arm)   Pulse 66   Temp 98.4 F (36.9 C) (Oral)   Resp 16   SpO2 96%  Physical Exam Vitals and nursing note reviewed.  Constitutional:      General: She is not in acute distress.    Appearance: Normal appearance. She is not ill-appearing, toxic-appearing or diaphoretic.  HENT:     Head: Normocephalic and atraumatic.     Nose: Nose normal.     Mouth/Throat:     Mouth: Mucous membranes are moist.     Pharynx: Oropharynx is clear.  Eyes:     Extraocular Movements: Extraocular  movements intact.     Conjunctiva/sclera: Conjunctivae normal.     Pupils: Pupils are equal, round, and reactive to light.  Cardiovascular:     Rate and Rhythm: Normal rate and regular rhythm.  Pulmonary:     Effort: Pulmonary effort is normal.     Breath sounds: Normal breath sounds. No wheezing.  Abdominal:     General: Abdomen is flat. Bowel sounds are normal.     Palpations: Abdomen is soft.     Tenderness: There is no abdominal tenderness.  Musculoskeletal:     Cervical back: Normal range of motion and neck supple. No tenderness.  Skin:    General: Skin is warm and dry.     Capillary Refill: Capillary refill takes less than 2 seconds.  Neurological:      General: No focal deficit present.     Mental Status: She is alert and oriented to person, place, and time.     GCS: GCS eye subscore is 4. GCS verbal subscore is 5. GCS motor subscore is 6.     Cranial Nerves: Cranial nerves 2-12 are intact. No cranial nerve deficit.     Sensory: Sensation is intact. No sensory deficit.     Motor: Motor function is intact. No weakness.     Coordination: Coordination is intact. Heel to [facility] Test normal.     Comments: Patient shows steady gait around hospital hallway.  Intact finger-nose, heel-to-shin.  Cranial nerves II through XII intact.  Equal grip strength to upper and lower extremities bilaterally.     ED Results / Procedures / Treatments   Labs (all labs ordered are listed, but only abnormal results are displayed) Labs Reviewed  CBC WITH DIFFERENTIAL/PLATELET - Abnormal; Notable for the following components:      Result Value   WBC 14.0 (*)    RBC 3.81 (*)    MCV 102.6 (*)    MCH 34.4 (*)    Neutro Abs 12.1 (*)    Abs Immature Granulocytes 0.09 (*)    All other components within normal limits  COMPREHENSIVE METABOLIC PANEL - Abnormal; Notable for the following components:   Glucose, Bld 118 (*)    Creatinine, Ser 1.24 (*)    Calcium 8.6 (*)    Total Protein 6.1 (*)    Albumin 3.3 (*)    AST 61 (*)    ALT 47 (*)    GFR, Estimated 59 (*)    All other components within normal limits  RAPID URINE DRUG SCREEN, HOSP PERFORMED  I-STAT BETA HCG BLOOD, ED ([facility], [facility], [facility] ONLY)    EKG None  Radiology No results found.  Procedures Procedures    Medications Ordered in ED Medications - No data to display  ED Course/ Medical Decision Making/ A&P  Medical Decision Making Amount and/or Complexity of Data Reviewed Labs: ordered.   37 year old female presents to the ED for evaluation.  Please see HPI for further details.  On my examination patient is afebrile and nontachycardic.  Lung sounds are clear bilaterally, she is not hypoxic.   Abdomen soft and compressible throughout.  Neurological examination shows no focal neurodeficits.  Patient alert and orient x 3.  On questioning, the patient admits to snorting unknown white substance yesterday.  Patient believes that it was fentanyl.  Patient denies using opiates on a consistent basis.  Patient reports that she was only experimenting with her friend.  Patient observed here for 6 hours 45 minutes.  Patient showed no signs of toxicity during this  time.  The patient has equal chest rise and fall, unlabored respirations.  Patient oxygen saturation 96% on room air.  The patient is requesting discharge.  I attempted to collect a urine sample from the patient to analyze for narcotics however the patient deferred this.  The patient is requesting discharge at this time.  Patient case discussed with attending Dr. Rogene Houston who voices agreement with plan of management, discharge.  The patient will be discharged and advised to follow-up with PCP.  Patient given return precautions and she voiced understanding.  Patient had all of her questions answered to her satisfaction.  Patient stable for discharge.  With resources concerning substance abuse.   Final Clinical Impression(s) / ED Diagnoses Final diagnoses:  Narcotic abuse [facility])    Rx / DC Orders ED Discharge Orders     None         Lawana Chambers 04/22/22 1227    Fredia Sorrow, MD 04/26/22 1523

## 2022-04-22 NOTE — ED Triage Notes (Signed)
Patient reports feet swelling onset yesterday , denies injury . Respirations unlabored /no fever .

## 2022-04-22 NOTE — Discharge Instructions (Addendum)
Return to ED with any new or worsening signs or symptoms Please follow-up with PCP for further management Please read attached guide concerning opioid use disorder Please read attached guide concerning resources in the [place] area for substance use

## 2022-04-22 NOTE — ED Notes (Signed)
Pt resting at this time with no apparent distress noted.

## 2022-04-22 NOTE — ED Triage Notes (Signed)
Pt reports sleeping at "cousin's house" after snorting a white powder. Family at bedside said she went to get pt and patient would not wake up. Family called friend and then went back this AM to get daughter to bring to ER. Pt c/o feeling tired and nauseated this AM, pt states "I am just really tired and want to sleep."

## 2024-01-04 LAB — AMB RESULTS CONSOLE CBG: Glucose: 123

## 2024-01-04 NOTE — Progress Notes (Signed)
 This pt attended 01/04/2024 screening event and BP was 128/75 and blood glucose was 123. Pt did not indicate any SDOH needs.  Further f/u to be per health equity protocol.

## 2024-02-22 NOTE — Progress Notes (Unsigned)
 Pt attended 01/04/2024 screening event with BP of 128/75 and blood sugar was 123.   **Per initial f/u pt was reached out via phone and was left vm. Pt was also mailed abnormal results letter per initial f/u.   **Per chart review pt does have a PCP, insurance, and is not a smoker. Pt has seen PCP 3 times since event, blood sugar and A1C addressed. Pt does not indicate any SDOH needs at this time.  **No additional pt f/u to be scheduled at this time per health equity protocol.

## 2024-03-30 NOTE — Progress Notes (Unsigned)
 Per the initial f/u interval, Pt attended 01/04/2024 screening event with BP of 128/75 and blood sugar was 123.   Per initial f/u pt was reached out via phone and was unable to contact pt directly, vm was left. Pt was mailed letter with [facility] flyer, [facility] flyer, BP management flyer, BP log, & reduced sodium alternatives flyer.   Per chart review pt does not have a specific PCP, has insurance, and is a former smoker. Pt does not indicate any SDOH needs at this time.  Additional pt f/u to be scheduled at this time per health equity protocol.  At the 60 day f/u interval, the pt does not have any new encounters since the initial f/u interval.  60 Day f/u Call Attempt #: CHW called pt to obtain PCP status. CHW was unable to reach the pt and left a vm for her to return call.  CHW sent letter with [facility],
